# Patient Record
Sex: Male | Born: 1975 | Race: White | Hispanic: No | Marital: Married | Smoking: Former smoker
Health system: Southern US, Community
[De-identification: ages and names within clinical notes are randomized; demographics above are authoritative.]

## PROBLEM LIST (undated history)

## (undated) DIAGNOSIS — B019 Varicella without complication: Secondary | ICD-10-CM

## (undated) DIAGNOSIS — I10 Essential (primary) hypertension: Secondary | ICD-10-CM

## (undated) HISTORY — DX: Varicella without complication: B01.9

## (undated) HISTORY — DX: Essential (primary) hypertension: I10

---

## 2007-11-10 HISTORY — PX: CYST REMOVAL HAND: SHX6279

## 2015-03-12 ENCOUNTER — Ambulatory Visit: Payer: Self-pay | Admitting: Primary Care

## 2015-03-21 ENCOUNTER — Encounter: Payer: Self-pay | Admitting: Nurse Practitioner

## 2015-03-21 ENCOUNTER — Encounter (INDEPENDENT_AMBULATORY_CARE_PROVIDER_SITE_OTHER): Payer: Self-pay

## 2015-03-21 ENCOUNTER — Ambulatory Visit (INDEPENDENT_AMBULATORY_CARE_PROVIDER_SITE_OTHER): Payer: BLUE CROSS/BLUE SHIELD | Admitting: Nurse Practitioner

## 2015-03-21 VITALS — BP 150/110 | HR 90 | Temp 98.7°F | Resp 12 | Ht 66.5 in | Wt 201.1 lb

## 2015-03-21 DIAGNOSIS — M542 Cervicalgia: Secondary | ICD-10-CM | POA: Insufficient documentation

## 2015-03-21 DIAGNOSIS — Z7689 Persons encountering health services in other specified circumstances: Secondary | ICD-10-CM | POA: Insufficient documentation

## 2015-03-21 DIAGNOSIS — Z7189 Other specified counseling: Secondary | ICD-10-CM

## 2015-03-21 DIAGNOSIS — Z131 Encounter for screening for diabetes mellitus: Secondary | ICD-10-CM

## 2015-03-21 DIAGNOSIS — Z13220 Encounter for screening for lipoid disorders: Secondary | ICD-10-CM

## 2015-03-21 DIAGNOSIS — Z130 Encounter for screening for diseases of the blood and blood-forming organs and certain disorders involving the immune mechanism: Secondary | ICD-10-CM

## 2015-03-21 DIAGNOSIS — I10 Essential (primary) hypertension: Secondary | ICD-10-CM | POA: Insufficient documentation

## 2015-03-21 LAB — CBC WITH DIFFERENTIAL/PLATELET
BASOS ABS: 0 10*3/uL (ref 0.0–0.1)
Basophils Relative: 0.4 % (ref 0.0–3.0)
EOS ABS: 0.1 10*3/uL (ref 0.0–0.7)
Eosinophils Relative: 1.7 % (ref 0.0–5.0)
HEMATOCRIT: 46.9 % (ref 39.0–52.0)
Hemoglobin: 16.4 g/dL (ref 13.0–17.0)
LYMPHS ABS: 2 10*3/uL (ref 0.7–4.0)
Lymphocytes Relative: 23.4 % (ref 12.0–46.0)
MCHC: 35 g/dL (ref 30.0–36.0)
MCV: 83 fl (ref 78.0–100.0)
Monocytes Absolute: 0.8 10*3/uL (ref 0.1–1.0)
Monocytes Relative: 9.1 % (ref 3.0–12.0)
Neutro Abs: 5.5 10*3/uL (ref 1.4–7.7)
Neutrophils Relative %: 65.4 % (ref 43.0–77.0)
Platelets: 213 10*3/uL (ref 150.0–400.0)
RBC: 5.65 Mil/uL (ref 4.22–5.81)
RDW: 13.1 % (ref 11.5–15.5)
WBC: 8.4 10*3/uL (ref 4.0–10.5)

## 2015-03-21 LAB — COMPREHENSIVE METABOLIC PANEL
ALK PHOS: 69 U/L (ref 39–117)
ALT: 61 U/L — ABNORMAL HIGH (ref 0–53)
AST: 22 U/L (ref 0–37)
Albumin: 4.9 g/dL (ref 3.5–5.2)
BILIRUBIN TOTAL: 0.7 mg/dL (ref 0.2–1.2)
BUN: 15 mg/dL (ref 6–23)
CO2: 31 mEq/L (ref 19–32)
Calcium: 10.6 mg/dL — ABNORMAL HIGH (ref 8.4–10.5)
Chloride: 103 mEq/L (ref 96–112)
Creatinine, Ser: 0.94 mg/dL (ref 0.40–1.50)
GFR: 96.02 mL/min (ref >60.00)
Glucose, Bld: 89 mg/dL (ref 70–99)
POTASSIUM: 4.7 meq/L (ref 3.5–5.1)
Sodium: 140 mEq/L (ref 135–145)
Total Protein: 7.5 g/dL (ref 6.0–8.3)

## 2015-03-21 LAB — LIPID PANEL
Cholesterol: 223 mg/dL — ABNORMAL HIGH (ref 0–200)
HDL: 40.1 mg/dL (ref >39.00)
LDL CALC: 158 mg/dL — AB (ref 0–99)
NonHDL: 182.9
Total CHOL/HDL Ratio: 6
Triglycerides: 127 mg/dL (ref 0.0–149.0)
VLDL: 25.4 mg/dL (ref 0.0–40.0)

## 2015-03-21 LAB — HEMOGLOBIN A1C: Hgb A1c MFr Bld: 4.9 % (ref 4.6–6.5)

## 2015-03-21 MED ORDER — CYCLOBENZAPRINE HCL 5 MG PO TABS: 5.0000 mg | ORAL_TABLET | Freq: Three times a day (TID) | ORAL | Status: DC | PRN

## 2015-03-21 MED ORDER — PREDNISONE 10 MG PO TABS: ORAL_TABLET | ORAL | Status: DC

## 2015-03-21 NOTE — Assessment & Plan Note (Signed)
Discussed acute and chronic issues. Reviewed health maintenance measures, PFSHx, and immunizations. Obtain routine labs Lipid panel, CBC w/ diff, A1c, and CMET.    

## 2015-03-21 NOTE — Progress Notes (Signed)
Pre visit review using our clinic review tool, if applicable. No additional management support is needed unless otherwise documented below in the visit note. 

## 2015-03-21 NOTE — Assessment & Plan Note (Signed)
Flexeril and prednisone taper rx sent to pharmacy. Gave instructions on taking prednisone and flexeril, discussed risks such as increased BP and aggravation of stomach as well as drowsiness respectively. Pt verbalized understanding. Will follow up in 4 weeks.

## 2015-03-21 NOTE — Patient Instructions (Addendum)
Please visit the lab before leaving today.   Welcome to [facility]!   Prednisone with breakfast  6 tablets on day 1, 5 tablets on day 2, 4 tablets on day 3, 3 tablets on day 4, 2 tablets day 5, 1 tablet on day 6...done!   Watch your BP because the prednisone can make it rise.

## 2015-03-21 NOTE — Progress Notes (Signed)
Subjective:    Patient ID: Shawn English, male    DOB: 06-30-1976, 39 y.o.   MRN: [id number]  HPI  Shawn English is a 39 yo male establishing care and a CC of right side neck muscle soreness x 2 months.   1) New pt info-  Diet- Eats at home more often than out   Exercise- Walking and yoga 3 x a week 1 hour  Immunizations- tdap 06/18/2012  Eye Exam- UTD, March 2016  Dental Exam- Last June  2) Chronic Problems-  HTN-Linsinopril in the past with nausea and sexual side effects    Lost 30 lbs   3) Acute Problems-  2 months of neck pain on the right side, burning pain from base of skull to top of shoulder, and constant. 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  Eyes: Negative for visual disturbance.  Respiratory: Negative for chest tightness, shortness of breath and wheezing.   Cardiovascular: Negative for chest pain, palpitations and leg swelling.  Gastrointestinal: Negative for nausea, vomiting and diarrhea.  Genitourinary: Negative for difficulty urinating.  Musculoskeletal: Positive for neck pain. Negative for myalgias, back pain, joint swelling, arthralgias, gait problem and neck stiffness.  Skin: Negative for rash.  Neurological: Negative for dizziness, weakness and numbness.  Psychiatric/Behavioral: The patient is not nervous/anxious.    Past Medical History  Diagnosis Date  . Chicken pox   . Hypertension     History   Social History  . Marital Status: Married    Spouse Name: N/A  . Number of Children: N/A  . Years of Education: N/A   Occupational History  . Not on file.   Social History  Main Topics  . Smoking status: Former Smoker    Types: Cigarettes    Quit date: 09/09/2014  . Smokeless tobacco: Never Used  . Alcohol Use: 0.6 oz/week    1 Standard drinks or equivalent per week     Comment: Occasional use   . Drug Use: No  . Sexual Activity:    Partners: Female     Comment: Fiance   Other Topics Concern  . Not on file   Social History Narrative   Lives with fiance    Child on the way, girl, due in July 2016    No Pets    Work at [employer]    College    Left handed   Caffeine- 2 cups of coffee, occasional soda    Enjoys playing guitar and traveling        Past Surgical History  Procedure Laterality Date  . Cyst removal hand  2009    Benign cyst removed from right arm    Family History  Problem Relation Age of Onset  . Hypertension Father   . Cancer Maternal Grandmother     breast    Allergies  Allergen Reactions  . Bee Venom Swelling  . Fish-Derived Products Anaphylaxis  . Penicillins Anaphylaxis  . Sulfa Antibiotics Nausea And Vomiting    No current outpatient prescriptions on file prior to visit.   No current facility-administered medications on file prior to visit.      Objective:  Physical Exam  Constitutional: He is oriented to person, place, and time. He appears well-developed and well-nourished. No distress.  BP 150/110 mmHg  Pulse 90  Temp(Src) 98.7 F (37.1 C) (Oral)  Resp 12  Ht 5' 6.5" (1.689 m)  Wt 201 lb 1.9 oz (91.227 kg)  BMI 31.98 kg/m2  SpO2 99%   HENT:  Head: Normocephalic and atraumatic.  Right Ear: External ear normal.  Left Ear: External ear normal.  Cardiovascular: Normal rate, regular rhythm, normal heart sounds and intact distal pulses.  Exam reveals no gallop and no friction rub.   No murmur heard. Pulmonary/Chest: Effort normal and breath sounds normal. No respiratory distress. He has no wheezes. He has no rales. He exhibits no tenderness.  Musculoskeletal: He exhibits tenderness.    Tenderness to palpation of right side of trapezius muscle to base of skull Decreased ROM of neck with extension and turning to the right.   Neurological: He is alert and oriented to person, place, and time.  Deltoid 5/5 Bilateral, Biceps 5/5 bilateral, Wrist extensors 5/5 bilateral, Triceps 5/5 bilateral, finger flexors and abductors 5/5 bilateral, grip 5/5 bilateraly no Hoffman's, intact heel/toe/sequential walking, sensation intact upper and lower extremities. Straight leg raise negative bilaterally. DTR's upper and lower 2+   Skin: Skin is warm and dry. No rash noted. He is not diaphoretic.  Psychiatric: He has a normal mood and affect. His behavior is normal. Judgment and thought content normal.      Assessment & Plan:

## 2015-03-21 NOTE — Assessment & Plan Note (Signed)
Uncontrolled. Patient and I discussed implications such as MI and stroke of uncontrolled high blood pressure and he verbalized understanding, but feels his BP is up due to pain. I discussed that prednisone taper may increase BP and he stated he will let me know if it worsens and we can discuss a BP medication. He expressed multiple times that he does not want to be on medication for HTN at this time. He is working on diet and exercise and is down 4 lbs. Will follow up in 4 weeks and obtain a CMET today.

## 2015-04-19 ENCOUNTER — Ambulatory Visit: Payer: BLUE CROSS/BLUE SHIELD | Admitting: Nurse Practitioner

## 2015-04-24 ENCOUNTER — Ambulatory Visit: Payer: BLUE CROSS/BLUE SHIELD | Admitting: Nurse Practitioner

## 2015-05-08 ENCOUNTER — Ambulatory Visit (INDEPENDENT_AMBULATORY_CARE_PROVIDER_SITE_OTHER): Payer: BLUE CROSS/BLUE SHIELD | Admitting: Nurse Practitioner

## 2015-05-08 ENCOUNTER — Encounter (INDEPENDENT_AMBULATORY_CARE_PROVIDER_SITE_OTHER): Payer: Self-pay

## 2015-05-08 ENCOUNTER — Encounter: Payer: Self-pay | Admitting: Nurse Practitioner

## 2015-05-08 VITALS — BP 162/106 | HR 97 | Temp 98.6°F | Resp 18 | Ht 66.5 in | Wt 203.4 lb

## 2015-05-08 DIAGNOSIS — Z889 Allergy status to unspecified drugs, medicaments and biological substances status: Secondary | ICD-10-CM | POA: Insufficient documentation

## 2015-05-08 DIAGNOSIS — I10 Essential (primary) hypertension: Secondary | ICD-10-CM | POA: Diagnosis not present

## 2015-05-08 DIAGNOSIS — M542 Cervicalgia: Secondary | ICD-10-CM | POA: Diagnosis not present

## 2015-05-08 DIAGNOSIS — Z9109 Other allergy status, other than to drugs and biological substances: Secondary | ICD-10-CM | POA: Diagnosis not present

## 2015-05-08 MED ORDER — HYDROCHLOROTHIAZIDE 12.5 MG PO TABS: 12.5000 mg | ORAL_TABLET | Freq: Every day | ORAL | Status: DC

## 2015-05-08 MED ORDER — EPINEPHRINE 0.3 MG/0.3ML IJ SOAJ
0.3000 mg | Freq: Once | INTRAMUSCULAR | Status: DC
Start: 2015-05-08 — End: 2016-08-24

## 2015-05-08 NOTE — Assessment & Plan Note (Signed)
BP Readings from Last 3 Encounters:  05/08/15 162/106  03/21/15 150/110   Lab Results  Component Value Date   CREATININE 0.94 03/21/2015   Will start HCTZ 12.5 mg. Potassium 4.7 in May. Asked for pt to call in a BP by July 11th (pt having a baby girl next week).

## 2015-05-08 NOTE — Patient Instructions (Addendum)
Call us July 11th to report a BP. You can always make a nurse appointment to pop in and have it checked (no copay) if you can't get to a machine. [phone]   See you in 3 months for HTN check up.   Good luck with everything next week!

## 2015-05-08 NOTE — Assessment & Plan Note (Signed)
Epi pen sent to pharmacy. Discussed usage and when to call EMS.

## 2015-05-08 NOTE — Progress Notes (Signed)
   Subjective:    Patient ID: Shawn English, male    DOB: Apr 14, 1976, 39 y.o.   MRN: [id number]  HPI  Shawn English is a 39 yo male with a follow up of HTN and Neck pain.   1) HTN- Lisinopril in past- sexual side effects and heart burn, not tried anything else, lost 30 lbs in the past and came off of medications.   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 Anaphylaxis reported. Pt's wife concerned and would like for him to have an epi-pen.   Review of Systems  Constitutional: Negative for fever, chills, diaphoresis and fatigue.  Eyes: Negative for visual disturbance.  Respiratory: Negative for chest tightness, shortness of breath and wheezing.   Cardiovascular: Negative for chest pain, palpitations and leg swelling.  Gastrointestinal: Negative for nausea, vomiting and diarrhea.  Endocrine: Negative for polydipsia, polyphagia and polyuria.  Skin: Negative for rash.  Neurological: Negative for dizziness, weakness and numbness.  Psychiatric/Behavioral: The patient is not nervous/anxious.       Objective:   Physical Exam  Constitutional: He is oriented to person, place, and time. He appears well-developed and well-nourished. No distress.  BP 162/106 mmHg  Pulse 97  Temp(Src) 98.6 F (37 C)  Resp 18  Ht 5' 6.5" (1.689 m)  Wt 203 lb 6.4 oz (92.262 kg)  BMI 32.34 kg/m2  SpO2 96% Rechecked twice    HENT:  Head: Normocephalic and atraumatic.  Right Ear: External ear normal.  Left Ear: External ear normal.  Eyes: EOM are normal. Pupils are equal, round, and reactive to light. Right eye exhibits no discharge. Left eye exhibits no discharge. No scleral icterus.  Cardiovascular: Normal rate, regular rhythm and normal heart sounds.  Exam reveals no gallop and no friction rub.   No murmur heard. Pulmonary/Chest: Effort normal and breath sounds normal. No respiratory distress. He has no wheezes. He has no  rales. He exhibits no tenderness.  Neurological: He is alert and oriented to person, place, and time.  Skin: Skin is warm and dry. No rash noted. He is not diaphoretic.  Psychiatric: He has a normal mood and affect. His behavior is normal. Judgment and thought content normal.      Assessment & Plan:

## 2015-05-08 NOTE — Assessment & Plan Note (Signed)
Improved with deep tissue massage. No further complaints

## 2015-05-08 NOTE — Progress Notes (Signed)
Pre visit review using our clinic review tool, if applicable. No additional management support is needed unless otherwise documented below in the visit note. 

## 2015-07-31 ENCOUNTER — Encounter: Payer: Self-pay | Admitting: Nurse Practitioner

## 2015-07-31 ENCOUNTER — Encounter (INDEPENDENT_AMBULATORY_CARE_PROVIDER_SITE_OTHER): Payer: Self-pay

## 2015-07-31 ENCOUNTER — Ambulatory Visit (INDEPENDENT_AMBULATORY_CARE_PROVIDER_SITE_OTHER): Payer: BLUE CROSS/BLUE SHIELD | Admitting: Nurse Practitioner

## 2015-07-31 VITALS — BP 158/118 | HR 86 | Temp 98.5°F | Resp 18 | Ht 67.0 in | Wt 201.8 lb

## 2015-07-31 DIAGNOSIS — I10 Essential (primary) hypertension: Secondary | ICD-10-CM

## 2015-07-31 DIAGNOSIS — Z23 Encounter for immunization: Secondary | ICD-10-CM

## 2015-07-31 LAB — BASIC METABOLIC PANEL
BUN: 12 mg/dL (ref 6–23)
CALCIUM: 10.1 mg/dL (ref 8.4–10.5)
CO2: 33 meq/L — AB (ref 19–32)
CREATININE: 0.98 mg/dL (ref 0.40–1.50)
Chloride: 102 mEq/L (ref 96–112)
GFR: 91.33 mL/min (ref >60.00)
Glucose, Bld: 91 mg/dL (ref 70–99)
Potassium: 4 mEq/L (ref 3.5–5.1)
Sodium: 141 mEq/L (ref 135–145)

## 2015-07-31 MED ORDER — LOSARTAN POTASSIUM 25 MG PO TABS: 25.0000 mg | ORAL_TABLET | Freq: Every day | ORAL | Status: DC

## 2015-07-31 NOTE — Progress Notes (Signed)
Patient ID: Shawn English, male    DOB: [birth year]  Age: 39 y.o. MRN: [id number]  CC: Follow-up   HPI Shawn English presents for follow up of HTN.   1) Started pt on HCTZ 12.5 mg, helpful, but not at goal.  Checked it once before and it was high he reports at [facility] pharmacy  Baby girl has murmur and he only slept 1 hr last night due to her going to a cardiology appointment this morning after our appnt.   Pt reports feeling okay. He is adjusting to new dad life well. No complaints today.   History Shawn English has a past medical history of Chicken pox and Hypertension.   He has past surgical history that includes Cyst removal hand (2009).   His family history includes Cancer in his maternal grandmother; Hypertension in his father.He reports that he quit smoking about 10 months ago. His smoking use included Cigarettes. He has never used smokeless tobacco. He reports that he drinks about 0.6 oz of alcohol per week. He reports that he does not use illicit drugs.  Outpatient Prescriptions Prior to Visit  Medication Sig Dispense Refill  . EPINEPHrine 0.3 mg/0.3 mL IJ SOAJ injection Inject 0.3 mLs (0.3 mg total) into the muscle once. 1 Device 2  . hydrochlorothiazide (HYDRODIURIL) 12.5 MG tablet Take 1 tablet (12.5 mg total) by mouth daily. 90 tablet 3  . cyclobenzaprine (FLEXERIL) 5 MG tablet Take 1 tablet (5 mg total) by mouth 3 (three) times daily as needed for muscle spasms. (Patient not taking: Reported on 07/31/2015) 30 tablet 1  . predniSONE (DELTASONE) 10 MG tablet Take 6 tablets with breakfast by mouth on day 1 then decrease by 1 tablet daily until done. (Patient not taking: Reported on 05/08/2015) 21 tablet 0   No facility-administered medications prior to visit.    ROS Review of Systems  Constitutional: Negative for fever, chills, diaphoresis and fatigue.  Eyes: Negative for visual disturbance.  Respiratory: Negative for chest tightness, shortness of breath and wheezing.   Cardiovascular:  Negative for chest pain, palpitations and leg swelling.  Gastrointestinal: Negative for nausea, vomiting and diarrhea.  Skin: Negative for rash.  Neurological: Negative for dizziness and weakness.  Psychiatric/Behavioral: The patient is nervous/anxious.     Objective:  BP 158/118 mmHg  Pulse 86  Temp(Src) 98.5 F (36.9 C)  Resp 18  Ht  (1.702 m)  Wt 201 lb 12.8 oz (91.536 kg)  BMI 31.60 kg/m2  SpO2 94%  Physical Exam  Constitutional: He is oriented to person, place, and time. He appears well-developed and well-nourished. No distress.  HENT:  Head: Normocephalic and atraumatic.  Right Ear: External ear normal.  Left Ear: External ear normal.  Cardiovascular: Normal rate, regular rhythm and normal heart sounds.  Exam reveals no gallop and no friction rub.   No murmur heard. Pulmonary/Chest: Effort normal and breath sounds normal. No respiratory distress. He has no wheezes. He has no rales. He exhibits no tenderness.  Neurological: He is alert and oriented to person, place, and time.  Skin: Skin is warm and dry. No rash noted. He is not diaphoretic.  Psychiatric: He has a normal mood and affect. His behavior is normal. Judgment and thought content normal.   Assessment & Plan:   Shawn English was seen today for follow-up.  Diagnoses and all orders for this visit:  Benign essential HTN -     Basic Metabolic Panel (BMET)  Essential hypertension  Other orders -     losartan (COZAAR)  25 MG tablet; Take 1 tablet (25 mg total) by mouth daily.   I am having Shawn English start on losartan. I am also having him maintain his predniSONE, cyclobenzaprine, hydrochlorothiazide, and EPINEPHrine.  Meds ordered this encounter  Medications  . losartan (COZAAR) 25 MG tablet    Sig: Take 1 tablet (25 mg total) by mouth daily.    Dispense:  30 tablet    Refill:  1    Order Specific Question:  Supervising Provider    Answer:  Sherlene Shams [[id number]]     Follow-up: Return in about 4 weeks  (around 08/28/2015) for HTN.

## 2015-07-31 NOTE — Assessment & Plan Note (Addendum)
BP Readings from Last 3 Encounters:  07/31/15 158/118  05/08/15 162/106  03/21/15 150/110   Uncontrolled on HCTZ, failed Lisinopril, willing to add Losartan 25 mg. Quick diet and exercise info to help with stress of new baby. Obtain BMET. FU in 4 weeks.

## 2015-07-31 NOTE — Progress Notes (Signed)
Pre visit review using our clinic review tool, if applicable. No additional management support is needed unless otherwise documented below in the visit note. 

## 2015-07-31 NOTE — Patient Instructions (Signed)
Please visit the lab.   Follow up in 4 weeks for HTN.   Losartan 25 mg with HCTZ.

## 2015-08-08 ENCOUNTER — Ambulatory Visit: Payer: BLUE CROSS/BLUE SHIELD | Admitting: Nurse Practitioner

## 2015-08-28 ENCOUNTER — Ambulatory Visit (INDEPENDENT_AMBULATORY_CARE_PROVIDER_SITE_OTHER): Payer: BLUE CROSS/BLUE SHIELD | Admitting: Nurse Practitioner

## 2015-08-28 ENCOUNTER — Encounter: Payer: Self-pay | Admitting: Nurse Practitioner

## 2015-08-28 VITALS — BP 150/122 | HR 89 | Temp 98.8°F | Resp 18 | Ht 67.0 in | Wt 202.8 lb

## 2015-08-28 DIAGNOSIS — I10 Essential (primary) hypertension: Secondary | ICD-10-CM | POA: Diagnosis not present

## 2015-08-28 MED ORDER — LOSARTAN POTASSIUM-HCTZ 50-12.5 MG PO TABS: 1.0000 | ORAL_TABLET | Freq: Every day | ORAL | Status: DC

## 2015-08-28 NOTE — Progress Notes (Signed)
Patient ID: Shawn English Binning, male    DOB: 1976/05/06  Age: 39 y.o. MRN: [id number]  CC: Follow-up   HPI Shawn English Lonigro presents for follow up for HTN.   1)  Ran out of HCTZ 2 weeks ago   Losartan this morning only   Not checking BP Slept 4 hours due to 383 month old last not  History Baldo AshCarl has a past medical history of Chicken pox and Hypertension.   He has past surgical history that includes Cyst removal hand (2009).   His family history includes Cancer in his maternal grandmother; Hypertension in his father.He reports that he quit smoking about a year ago. His smoking use included Cigarettes. He has never used smokeless tobacco. He reports that he drinks about 0.6 oz of alcohol per week. He reports that he does not use illicit drugs.  Outpatient Prescriptions Prior to Visit  Medication Sig Dispense Refill  . EPINEPHrine 0.3 mg/0.3 mL IJ SOAJ injection Inject 0.3 mLs (0.3 mg total) into the muscle once. 1 Device 2  . hydrochlorothiazide (HYDRODIURIL) 12.5 MG tablet Take 1 tablet (12.5 mg total) by mouth daily. 90 tablet 3  . losartan (COZAAR) 25 MG tablet Take 1 tablet (25 mg total) by mouth daily. 30 tablet 1  . cyclobenzaprine (FLEXERIL) 5 MG tablet Take 1 tablet (5 mg total) by mouth 3 (three) times daily as needed for muscle spasms. (Patient not taking: Reported on 07/31/2015) 30 tablet 1  . predniSONE (DELTASONE) 10 MG tablet Take 6 tablets with breakfast by mouth on day 1 then decrease by 1 tablet daily until done. (Patient not taking: Reported on 05/08/2015) 21 tablet 0   No facility-administered medications prior to visit.    ROS Review of Systems  Constitutional: Negative for fever, chills, diaphoresis and fatigue.  Eyes: Negative for visual disturbance.  Respiratory: Negative for chest tightness, shortness of breath and wheezing.   Cardiovascular: Negative for chest pain, palpitations and leg swelling.  Neurological: Negative for dizziness, weakness and numbness.   Psychiatric/Behavioral: The patient is not nervous/anxious.     Objective:  BP 150/122 mmHg  Pulse 89  Temp(Src) 98.8 F (37.1 C)  Resp 18  Ht 5\' 7"  (1.702 m)  Wt 202 lb 12.8 oz (91.989 kg)  BMI 31.76 kg/m2  SpO2 96%  Physical Exam  Constitutional: He is oriented to person, place, and time. He appears well-developed and well-nourished. No distress.  HENT:  Head: Normocephalic and atraumatic.  Right Ear: External ear normal.  Left Ear: External ear normal.  Cardiovascular: Normal rate, regular rhythm and normal heart sounds.  Exam reveals no gallop and no friction rub.   No murmur heard. Pulmonary/Chest: Effort normal and breath sounds normal. No respiratory distress. He has no wheezes. He has no rales. He exhibits no tenderness.  Neurological: He is alert and oriented to person, place, and time.  Skin: Skin is warm and dry. No rash noted. He is not diaphoretic.  Psychiatric: He has a normal mood and affect. His behavior is normal. Judgment and thought content normal.   Assessment & Plan:   There are no diagnoses linked to this encounter. I have discontinued Mr. Marcina MillardRoach's predniSONE, cyclobenzaprine, hydrochlorothiazide, and losartan. I am also having him start on losartan-hydrochlorothiazide. Additionally, I am having him maintain his EPINEPHrine.  Meds ordered this encounter  Medications  . losartan-hydrochlorothiazide (HYZAAR) 50-12.5 MG tablet    Sig: Take 1 tablet by mouth daily.    Dispense:  90 tablet    Refill:  0  Order Specific Question:  Supervising Provider    Answer:  Sherlene Shams [[id number]]     Follow-up: Return in about 3 weeks (around 09/18/2015).

## 2015-08-28 NOTE — Assessment & Plan Note (Signed)
BP Readings from Last 3 Encounters:  08/28/15 150/122  07/31/15 158/118  05/08/15 162/106   Uncontrolled. Pt has not been taking both for the last 2 weeks because he ran out. I asked him to contact me if this happens next time. Will change to Hyzaar 50-12.5 mg. FU in 3 weeks.

## 2015-08-28 NOTE — Progress Notes (Signed)
Pre visit review using our clinic review tool, if applicable. No additional management support is needed unless otherwise documented below in the visit note. 

## 2015-08-28 NOTE — Patient Instructions (Signed)
Please try Losartan- HCTZ and follow up with me in 3 weeks.

## 2015-09-26 ENCOUNTER — Encounter: Payer: Self-pay | Admitting: Nurse Practitioner

## 2015-09-26 ENCOUNTER — Ambulatory Visit (INDEPENDENT_AMBULATORY_CARE_PROVIDER_SITE_OTHER): Payer: BLUE CROSS/BLUE SHIELD | Admitting: Nurse Practitioner

## 2015-09-26 VITALS — BP 160/104 | HR 82 | Temp 99.4°F | Resp 18 | Ht 67.0 in | Wt 204.4 lb

## 2015-09-26 DIAGNOSIS — I10 Essential (primary) hypertension: Secondary | ICD-10-CM | POA: Diagnosis not present

## 2015-09-26 NOTE — Patient Instructions (Signed)
Amlodipine (norvasc) is the third component (5 mg we would start you at).   See you in Jan.

## 2015-09-26 NOTE — Progress Notes (Signed)
Patient ID: Shawn MeyerCarl Aken, male    DOB: 1976/01/05  Age: 39 y.o. MRN: [id number]  CC: Follow-up   HPI Shawn English presents for follow up of HTN.   1) Tried lisinopril in past, pt reports his office is doing some form of health measures and he reports this starts on Jan. 2nd 2017. He is motivated to lose weight, become more active, work on coping with stress. He is adamant about trying these measures to bring down his BP instead of adding another medication.    History Shawn English has a past medical history of Chicken pox and Hypertension.   He has past surgical history that includes Cyst removal hand (2009).   His family history includes Cancer in his maternal grandmother; Hypertension in his father.He reports that he quit smoking about 12 months ago. His smoking use included Cigarettes. He has never used smokeless tobacco. He reports that he drinks about 0.6 oz of alcohol per week. He reports that he does not use illicit drugs.  Outpatient Prescriptions Prior to Visit  Medication Sig Dispense Refill  . EPINEPHrine 0.3 mg/0.3 mL IJ SOAJ injection Inject 0.3 mLs (0.3 mg total) into the muscle once. 1 Device 2  . losartan-hydrochlorothiazide (HYZAAR) 50-12.5 MG tablet Take 1 tablet by mouth daily. 90 tablet 0   No facility-administered medications prior to visit.    ROS Review of Systems  Constitutional: Negative for fever, chills, diaphoresis and fatigue.  Eyes: Negative for visual disturbance.  Respiratory: Negative for chest tightness, shortness of breath and wheezing.   Cardiovascular: Negative for chest pain, palpitations and leg swelling.  Gastrointestinal: Negative for nausea, vomiting and diarrhea.  Skin: Negative for rash.  Neurological: Negative for dizziness, weakness and numbness.  Psychiatric/Behavioral: The patient is not nervous/anxious.     Objective:  BP 160/104 mmHg  Pulse 82  Temp(Src) 99.4 F (37.4 C)  Resp 18  Ht 5\' 7"  (1.702 m)  Wt 204 lb 6.4 oz (92.715 kg)  BMI  32.01 kg/m2  SpO2 97%  Physical Exam  Constitutional: He is oriented to person, place, and time. He appears well-developed and well-nourished. No distress.  HENT:  Head: Normocephalic and atraumatic.  Right Ear: External ear normal.  Left Ear: External ear normal.  Neurological: He is alert and oriented to person, place, and time.  Skin: Skin is warm and dry. No rash noted. He is not diaphoretic.  Psychiatric: He has a normal mood and affect. His behavior is normal. Judgment and thought content normal.   Assessment & Plan:   There are no diagnoses linked to this encounter. I am having Shawn English maintain his EPINEPHrine and losartan-hydrochlorothiazide.  No orders of the defined types were placed in this encounter.     Follow-up: Return in about 2 months (around 11/26/2015) for HTN follow up.

## 2015-09-26 NOTE — Progress Notes (Signed)
Pre visit review using our clinic review tool, if applicable. No additional management support is needed unless otherwise documented below in the visit note. 

## 2015-09-26 NOTE — Assessment & Plan Note (Signed)
BP Readings from Last 3 Encounters:  09/26/15 160/104  08/28/15 150/122  07/31/15 158/118   Uncontrolled. Pt reports he is doing well on Hyzaar without side effects. He is adamant about not adding another med at this time and wants to work on diet and exercise. Asked him to look at amlodipine as another possible option. Will follow up in Jan.

## 2015-11-25 ENCOUNTER — Ambulatory Visit (INDEPENDENT_AMBULATORY_CARE_PROVIDER_SITE_OTHER): Payer: BLUE CROSS/BLUE SHIELD | Admitting: Nurse Practitioner

## 2015-11-25 ENCOUNTER — Encounter: Payer: Self-pay | Admitting: Nurse Practitioner

## 2015-11-25 VITALS — BP 134/96 | HR 78 | Temp 98.6°F | Resp 16 | Ht 67.0 in | Wt 205.0 lb

## 2015-11-25 DIAGNOSIS — I10 Essential (primary) hypertension: Secondary | ICD-10-CM | POA: Diagnosis not present

## 2015-11-25 MED ORDER — LOSARTAN POTASSIUM-HCTZ 50-12.5 MG PO TABS: 1.0000 | ORAL_TABLET | Freq: Every day | ORAL | Status: DC

## 2015-11-25 NOTE — Patient Instructions (Signed)
Keep working with Risk analysthealth coach and nurse.   Please get me a copy of your lab work [phone] fax number

## 2015-11-25 NOTE — Progress Notes (Signed)
Patient ID: Shawn English, male    DOB: May 01, 1976  Age: 40 y.o. MRN: [id number]  CC: Hypertension   HPI Shawn English presents for HTN follow up.   1) Improving, but not at goal 2 hrs sleep and stress due to work  Started with health coach at work, seeing a Engineer, civil (consulting) at his work  [employer] getting full panel tomorrow    History Shawn English has a past medical history of Chicken pox and Hypertension.   He has past surgical history that includes Cyst removal hand (2009).   His family history includes Cancer in his maternal grandmother; Hypertension in his father.He reports that he quit smoking about 14 months ago. His smoking use included Cigarettes. He has never used smokeless tobacco. He reports that he drinks about 0.6 oz of alcohol per week. He reports that he does not use illicit drugs.  Outpatient Prescriptions Prior to Visit  Medication Sig Dispense Refill  . EPINEPHrine 0.3 mg/0.3 mL IJ SOAJ injection Inject 0.3 mLs (0.3 mg total) into the muscle once. 1 Device 2  . losartan-hydrochlorothiazide (HYZAAR) 50-12.5 MG tablet Take 1 tablet by mouth daily. 90 tablet 0   No facility-administered medications prior to visit.    ROS Review of Systems  Constitutional: Negative for fever, chills, diaphoresis and fatigue.  HENT: Negative for tinnitus and trouble swallowing.   Eyes: Negative for visual disturbance.  Respiratory: Negative for chest tightness, shortness of breath and wheezing.   Cardiovascular: Negative for chest pain, palpitations and leg swelling.  Skin: Negative for rash.  Neurological: Negative for dizziness, weakness and numbness.  Psychiatric/Behavioral: The patient is not nervous/anxious.     Objective:  BP 134/96 mmHg  Pulse 78  Temp(Src) 98.6 F (37 C) (Oral)  Resp 16  Ht 5\' 7"  (1.702 m)  Wt 205 lb (92.987 kg)  BMI 32.10 kg/m2  SpO2 98%  Physical Exam  Constitutional: He is oriented to person, place, and time. He appears well-developed and well-nourished. No distress.   HENT:  Head: Normocephalic and atraumatic.  Right Ear: External ear normal.  Left Ear: External ear normal.  Eyes: EOM are normal. Pupils are equal, round, and reactive to light. Right eye exhibits no discharge. Left eye exhibits no discharge. No scleral icterus.  Cardiovascular: Normal rate, regular rhythm and normal heart sounds.  Exam reveals no gallop and no friction rub.   No murmur heard. Pulmonary/Chest: Effort normal and breath sounds normal. No respiratory distress. He has no wheezes. He has no rales. He exhibits no tenderness.  Neurological: He is alert and oriented to person, place, and time.  Skin: Skin is warm and dry. No rash noted. He is not diaphoretic.  Psychiatric: He has a normal mood and affect. His behavior is normal. Judgment and thought content normal.   Assessment & Plan:   Shawn English was seen today for hypertension.  Diagnoses and all orders for this visit:  Essential hypertension  Other orders -     losartan-hydrochlorothiazide (HYZAAR) 50-12.5 MG tablet; Take 1 tablet by mouth daily.   I am having Shawn English maintain his EPINEPHrine and losartan-hydrochlorothiazide.  Meds ordered this encounter  Medications  . losartan-hydrochlorothiazide (HYZAAR) 50-12.5 MG tablet    Sig: Take 1 tablet by mouth daily.    Dispense:  90 tablet    Refill:  0    Order Specific Question:  Supervising Provider    Answer:  Sherlene Shams [[id number]]     Follow-up: Return in about 3 months (around 02/23/2016) for Follow up  for HTN.

## 2015-11-25 NOTE — Assessment & Plan Note (Addendum)
BP Readings from Last 3 Encounters:  11/25/15 134/96  09/26/15 160/104  08/28/15 150/122   Improving, pt reports he slept 1.5 hrs and had to be at work for an IT issue. He is getting lab work for his job at [employer] tomorrow. He is working with a Control and instrumentation engineerhealth coach at his work to improve his [employer] and health. FU in 3 months. Gave him fax  to send his lab results. Refills sent

## 2015-11-26 ENCOUNTER — Ambulatory Visit: Payer: BLUE CROSS/BLUE SHIELD | Admitting: Nurse Practitioner

## 2016-02-27 ENCOUNTER — Ambulatory Visit (INDEPENDENT_AMBULATORY_CARE_PROVIDER_SITE_OTHER): Payer: BLUE CROSS/BLUE SHIELD | Admitting: Nurse Practitioner

## 2016-02-27 ENCOUNTER — Encounter: Payer: Self-pay | Admitting: Nurse Practitioner

## 2016-02-27 VITALS — BP 147/96 | HR 87 | Temp 98.2°F | Ht 67.0 in | Wt 206.5 lb

## 2016-02-27 DIAGNOSIS — I10 Essential (primary) hypertension: Secondary | ICD-10-CM

## 2016-02-27 MED ORDER — LOSARTAN POTASSIUM-HCTZ 50-12.5 MG PO TABS: 1.0000 | ORAL_TABLET | Freq: Every day | ORAL | Status: DC

## 2016-02-27 NOTE — Progress Notes (Signed)
Pre visit review using our clinic review tool, if applicable. No additional management support is needed unless otherwise documented below in the visit note. 

## 2016-02-27 NOTE — Patient Instructions (Signed)
Follow up in 3 months for blood pressure.   Fax me your labs [phone] [facility] [place] [facility]- Attn: Naomie DeanCarrie Indie Nickerson

## 2016-02-27 NOTE — Assessment & Plan Note (Signed)
BP Readings from Last 3 Encounters:  02/27/16 147/96  11/25/15 134/96  09/26/15 160/104   Pt has been off of medication x 2 days. Refilled today. Pt had recent lab work at [employer] and will fax to me.

## 2016-02-27 NOTE — Progress Notes (Signed)
Patient ID: Shawn English, male    DOB: 13-Mar-1976  Age: 40 y.o. MRN: [id number]  CC: Follow-up   HPI Shawn English presents for follow up of HTN.   1) BP running 132/87's at home  Ran out yesterday of Hyzaar  Wt 202.02 lbs at home   Just moved recently and walks in the park with his daughter in a stroller most nights  Denies chest pain, tightness, SOB, DOE, leg swelling, nor palpitations.   History Shawn English has a past medical history of Chicken pox and Hypertension.   Shawn English has past surgical history that includes Cyst removal hand (2009).   His family history includes Cancer in his maternal grandmother; Hypertension in his father.Shawn English reports that Shawn English quit smoking about 17 months ago. His smoking use included Cigarettes. Shawn English has never used smokeless tobacco. Shawn English reports that Shawn English drinks about 0.6 oz of alcohol per week. Shawn English reports that Shawn English does not use illicit drugs.  Outpatient Prescriptions Prior to Visit  Medication Sig Dispense Refill  . EPINEPHrine 0.3 mg/0.3 mL IJ SOAJ injection Inject 0.3 mLs (0.3 mg total) into the muscle once. 1 Device 2  . losartan-hydrochlorothiazide (HYZAAR) 50-12.5 MG tablet Take 1 tablet by mouth daily. 90 tablet 0   No facility-administered medications prior to visit.    ROS Review of Systems  Constitutional: Negative for fever, chills, diaphoresis and fatigue.  Eyes: Negative for visual disturbance.  Respiratory: Negative for chest tightness, shortness of breath and wheezing.   Cardiovascular: Negative for chest pain, palpitations and leg swelling.  Neurological: Negative for dizziness and numbness.   Objective:  BP 147/96 mmHg  Pulse 87  Temp(Src) 98.2 F (36.8 C) (Oral)  Ht 5' 7"  (1.702 m)  Wt 206 lb 8 oz (93.668 kg)  BMI 32.33 kg/m2  SpO2 99%  Physical Exam  Constitutional: Shawn English is oriented to person, place, and time. Shawn English appears well-developed and well-nourished. No distress.  HENT:  Head: Normocephalic and atraumatic.  Right Ear: External ear normal.   Left Ear: External ear normal.  Cardiovascular: Normal rate, regular rhythm and normal heart sounds.   Pulmonary/Chest: Effort normal and breath sounds normal. No respiratory distress. Shawn English has no wheezes. Shawn English has no rales. Shawn English exhibits no tenderness.  Neurological: Shawn English is alert and oriented to person, place, and time.  Skin: Skin is warm and dry. No rash noted. Shawn English is not diaphoretic.  Psychiatric: Shawn English has a normal mood and affect. His behavior is normal. Judgment and thought content normal.   Assessment & Plan:   Shawn English was seen today for follow-up.  Diagnoses and all orders for this visit:  Essential hypertension -     Cancel: CBC with Differential/Platelet -     Cancel: Comp Met (CMET)  Other orders -     losartan-hydrochlorothiazide (HYZAAR) 50-12.5 MG tablet; Take 1 tablet by mouth daily.   I am having Shawn English maintain his EPINEPHrine and losartan-hydrochlorothiazide.  Meds ordered this encounter  Medications  . losartan-hydrochlorothiazide (HYZAAR) 50-12.5 MG tablet    Sig: Take 1 tablet by mouth daily.    Dispense:  90 tablet    Refill:  1     Follow-up: Return in about 3 months (around 05/28/2016).

## 2016-06-04 ENCOUNTER — Encounter: Payer: Self-pay | Admitting: Family Medicine

## 2016-06-04 ENCOUNTER — Ambulatory Visit: Payer: BLUE CROSS/BLUE SHIELD | Admitting: Nurse Practitioner

## 2016-06-04 ENCOUNTER — Encounter (INDEPENDENT_AMBULATORY_CARE_PROVIDER_SITE_OTHER): Payer: Self-pay

## 2016-06-04 ENCOUNTER — Ambulatory Visit (INDEPENDENT_AMBULATORY_CARE_PROVIDER_SITE_OTHER): Payer: BLUE CROSS/BLUE SHIELD | Admitting: Family Medicine

## 2016-06-04 VITALS — BP 136/88 | HR 99 | Temp 98.3°F | Wt 203.4 lb

## 2016-06-04 DIAGNOSIS — I10 Essential (primary) hypertension: Secondary | ICD-10-CM | POA: Diagnosis not present

## 2016-06-04 DIAGNOSIS — E669 Obesity, unspecified: Secondary | ICD-10-CM | POA: Diagnosis not present

## 2016-06-04 LAB — BASIC METABOLIC PANEL
BUN: 12 mg/dL (ref 6–23)
CHLORIDE: 101 meq/L (ref 96–112)
CO2: 31 mEq/L (ref 19–32)
Calcium: 10.2 mg/dL (ref 8.4–10.5)
Creatinine, Ser: 1.02 mg/dL (ref 0.40–1.50)
GFR: 86.82 mL/min (ref >60.00)
GLUCOSE: 87 mg/dL (ref 70–99)
Potassium: 4 mEq/L (ref 3.5–5.1)
Sodium: 139 mEq/L (ref 135–145)

## 2016-06-04 NOTE — Assessment & Plan Note (Signed)
Weight is down 3 pounds since his last visit. Encouraged to continue to work on diet and exercise.

## 2016-06-04 NOTE — Progress Notes (Signed)
  Marikay Alar, MD Phone: [phone]  Shawn English is a 40 y.o. male who presents today for f/u.  HYPERTENSION  Disease Monitoring  Home BP Monitoring not checking consistently, last checked 3 weeks ago 140/90 Chest pain- no    Dyspnea- no Medications  Compliance-  Taking losartan-HCTZ.   Edema- no  Obesity: Patient notes she's been working on diet. Has cut out carbs. Sodium is mostly nonexistent. He is not eating any refined sugars. He started walking though recently this has been limited by the heat and he plans to get back to this one cycles off.  PMH: Former smoker   ROS see history of present illness  Objective  Physical Exam Vitals:   06/04/16 0802  BP: 136/88  Pulse: 99  Temp: 98.3 F (36.8 C)    BP Readings from Last 3 Encounters:  06/04/16 136/88  02/27/16 (!) 147/96  11/25/15 (!) 134/96   Wt Readings from Last 3 Encounters:  06/04/16 203 lb 6.4 oz (92.3 kg)  02/27/16 206 lb 8 oz (93.7 kg)  11/25/15 205 lb (93 kg)    Physical Exam  Constitutional: No distress.  HENT:  Head: Normocephalic and atraumatic.  Cardiovascular: Normal rate and regular rhythm.   Pulmonary/Chest: Effort normal and breath sounds normal.  Neurological: He is alert.  Skin: Skin is warm and dry. He is not diaphoretic.     Assessment/Plan: Please see individual problem list.  Essential hypertension At goal. Continue current medication. Continue to work on diet and exercise.  Obesity Weight is down 3 pounds since his last visit. Encouraged to continue to work on diet and exercise.   Orders Placed This Encounter  Procedures  . Basic Metabolic Panel (BMET)    Marikay Alar, MD [facility]

## 2016-06-04 NOTE — Patient Instructions (Signed)
Nice to meet you. Please continue your current blood pressure medication. Please check your blood pressure at home several days a week. If it is persistently greater than 140/90 please let us know so we can increase your medication. Please continue to work on diet and exercise. Please try to obtain a lab results from your work physical from January.

## 2016-06-04 NOTE — Progress Notes (Signed)
Pre visit review using our clinic review tool, if applicable. No additional management support is needed unless otherwise documented below in the visit note. 

## 2016-06-04 NOTE — Assessment & Plan Note (Signed)
At goal. Continue current medication. Continue to work on diet and exercise.

## 2016-08-24 ENCOUNTER — Ambulatory Visit (INDEPENDENT_AMBULATORY_CARE_PROVIDER_SITE_OTHER): Payer: BLUE CROSS/BLUE SHIELD | Admitting: Family Medicine

## 2016-08-24 ENCOUNTER — Encounter: Payer: Self-pay | Admitting: Family Medicine

## 2016-08-24 DIAGNOSIS — Z87892 Personal history of anaphylaxis: Secondary | ICD-10-CM

## 2016-08-24 DIAGNOSIS — I10 Essential (primary) hypertension: Secondary | ICD-10-CM

## 2016-08-24 MED ORDER — EPINEPHRINE 0.3 MG/0.3ML IJ SOAJ: 0.3000 mg | INTRAMUSCULAR | 0 refills | Status: DC | PRN

## 2016-08-24 MED ORDER — LOSARTAN POTASSIUM-HCTZ 50-12.5 MG PO TABS: 1.0000 | ORAL_TABLET | Freq: Every day | ORAL | 3 refills | Status: DC

## 2016-08-24 NOTE — Assessment & Plan Note (Signed)
Not at goal today though patient has not slept in 48 hours or taken his medication this morning. Advised to take his medication when he gets home this morning. He will return in 1-2 weeks for a nurse visit for blood pressure check when he has gotten some sleep. If not at goal at that time we will discuss adding additional medication.

## 2016-08-24 NOTE — Progress Notes (Signed)
  Shawn AlarEric Trevone Prestwood, MD Phone: [phone]  Shawn English is a 40 y.o. male who presents today for follow-up.  HYPERTENSION  Disease Monitoring  Home BP Monitoring to 120 to 130 over 70s Chest pain- no    Dyspnea- no Medications  Compliance-  taking losartan, HCTZ.   Edema- no Patient reports he has been awake for the last 48 hours at work. He also reports he has not taken his medication this morning.  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 Can have anaphylactic symptoms with Fish derived products and penicillins.  ROS see history of present illness  Objective  Physical Exam Vitals:   08/24/16 0759  BP: (!) 164/92  Pulse: 91  Temp: 98.6 F (37 C)    BP Readings from Last 3 Encounters:  08/24/16 (!) 164/92  06/04/16 136/88  02/27/16 (!) 147/96   Wt Readings from Last 3 Encounters:  08/24/16 201 lb 3.2 oz (91.3 kg)  06/04/16 203 lb 6.4 oz (92.3 kg)  02/27/16 206 lb 8 oz (93.7 kg)    Physical Exam  Constitutional: He is well-developed, well-nourished, and in no distress.  Cardiovascular: Normal rate, regular rhythm and normal heart sounds.   Pulmonary/Chest: Effort normal and breath sounds normal.  Musculoskeletal: He exhibits no edema.  Neurological: He is alert. Gait normal.  Skin: Skin is warm and dry.     Assessment/Plan: Please see individual problem list.  Essential hypertension Not at goal today though patient has not slept in 48 hours or taken his medication this morning. Advised to take his medication when he gets home this morning. He will return in 1-2 weeks for a nurse visit for blood pressure check when he has gotten some sleep. If not at goal at that time we will discuss adding additional medication.  History of anaphylaxis Patient reports his EpiPen recently expired. We'll give him a refill of 2 devices to have one with him and one at home. Advised that if he has to use  these at any point he needs to go to the emergency room.   No orders of the defined types were placed in this encounter.   Meds ordered this encounter  Medications  . losartan-hydrochlorothiazide (HYZAAR) 50-12.5 MG tablet    Sig: Take 1 tablet by mouth daily.    Dispense:  90 tablet    Refill:  3  . EPINEPHrine 0.3 mg/0.3 mL IJ SOAJ injection    Sig: Inject 0.3 mLs (0.3 mg total) into the muscle as needed.    Dispense:  2 Device    Refill:  0    Shawn AlarEric Ennis Heavner, MD [facility] [place]

## 2016-08-24 NOTE — Assessment & Plan Note (Signed)
Patient reports his EpiPen recently expired. We'll give him a refill of 2 devices to have one with him and one at home. Advised that if he has to use these at any point he needs to go to the emergency room.

## 2016-08-24 NOTE — Patient Instructions (Signed)
Nice to see you. We will have you come back in 1-2 weeks for nurse check of your blood pressure. Please take your blood pressure medication when you get home. If you have to use your EpiPen you need to go to the emergency room immediately. If you develop chest pain, shortness of breath, or any new or changing symptoms please seek medical attention medially.

## 2016-08-24 NOTE — Progress Notes (Signed)
Pre visit review using our clinic review tool, if applicable. No additional management support is needed unless otherwise documented below in the visit note. 

## 2016-09-07 ENCOUNTER — Ambulatory Visit (INDEPENDENT_AMBULATORY_CARE_PROVIDER_SITE_OTHER): Payer: BLUE CROSS/BLUE SHIELD

## 2016-09-07 VITALS — BP 118/88 | HR 86

## 2016-09-07 DIAGNOSIS — I10 Essential (primary) hypertension: Secondary | ICD-10-CM

## 2016-09-07 NOTE — Progress Notes (Signed)
Patient presents for follow up blood pressure check from visit on 08/24/2016 due to he had not taken medications and blood pressure was elevated.   Patient reports he has been taking medications daily.

## 2016-09-08 NOTE — Progress Notes (Signed)
Blood pressure at goal. Continue current management.  Marikay AlarEric Hugh Garrow, M.D.

## 2016-11-20 ENCOUNTER — Encounter: Payer: Self-pay | Admitting: Family Medicine

## 2016-11-21 LAB — CBC AND DIFFERENTIAL
HCT: 48 % (ref 41–53)
HEMOGLOBIN: 15.6 g/dL (ref 13.5–17.5)
Neutrophils Absolute: 5 /uL
Platelets: 228 10*3/uL (ref 150–399)

## 2016-11-21 LAB — BASIC METABOLIC PANEL
BUN: 14 mg/dL (ref 4–21)
CREATININE: 1 mg/dL (ref 0.6–1.3)
Glucose: 95 mg/dL
POTASSIUM: 3.9 mmol/L (ref 3.4–5.3)
Sodium: 140 mmol/L (ref 137–147)

## 2016-11-21 LAB — LIPID PANEL
Cholesterol: 216 mg/dL — AB (ref 0–200)
HDL: 47 mg/dL (ref 35–70)
LDL Cholesterol: 141 mg/dL
Triglycerides: 140 mg/dL (ref 40–160)

## 2016-11-21 LAB — HEPATIC FUNCTION PANEL
ALK PHOS: 74 U/L (ref 25–125)
ALT: 51 U/L — AB (ref 10–40)
AST: 28 U/L (ref 14–40)
Bilirubin, Total: 0.9 mg/dL

## 2016-11-21 LAB — TSH: TSH: 2.36 u[IU]/mL (ref 0.41–5.90)

## 2016-11-24 ENCOUNTER — Encounter: Payer: Self-pay | Admitting: Family Medicine

## 2016-11-24 ENCOUNTER — Ambulatory Visit (INDEPENDENT_AMBULATORY_CARE_PROVIDER_SITE_OTHER): Payer: BLUE CROSS/BLUE SHIELD | Admitting: Family Medicine

## 2016-11-24 VITALS — BP 144/92 | HR 89 | Temp 98.5°F | Wt 199.8 lb

## 2016-11-24 DIAGNOSIS — E6609 Other obesity due to excess calories: Secondary | ICD-10-CM

## 2016-11-24 DIAGNOSIS — I10 Essential (primary) hypertension: Secondary | ICD-10-CM | POA: Diagnosis not present

## 2016-11-24 DIAGNOSIS — Z6832 Body mass index (BMI) 32.0-32.9, adult: Secondary | ICD-10-CM | POA: Diagnosis not present

## 2016-11-24 MED ORDER — LOSARTAN POTASSIUM 50 MG PO TABS: 50.0000 mg | ORAL_TABLET | Freq: Every day | ORAL | 3 refills | Status: DC

## 2016-11-24 MED ORDER — HYDROCHLOROTHIAZIDE 25 MG PO TABS: 25.0000 mg | ORAL_TABLET | Freq: Every day | ORAL | 3 refills | Status: DC

## 2016-11-24 NOTE — Assessment & Plan Note (Signed)
Down 2 additional pounds. Discussed continuing diet changes. Encouraged to get back to exercising.

## 2016-11-24 NOTE — Patient Instructions (Signed)
Nice to see you. We are going to increase the dose of your hydrochlorothiazide. You're going to be on 2 separate pills at this time. We will have you return in a month for recheck your blood pressure and lab work. Please continue to work on diet and exercise as well.

## 2016-11-24 NOTE — Progress Notes (Signed)
  Marikay AlarEric Stephaine Breshears, MD Phone: [phone]  Shawn English is a 41 y.o. male who presents today for follow-up.  Hypertension: Checking at home and typically in the 135/86 range. Taking losartan HCTZ 50-12.5 mg. No chest pain, shortness breath, or edema.  Has been working on diet changes and is eating veggie heavy diet. Lots of soy and beans and carrots and zucchini. Eating chicken for protein. Staying away from processed foods. Has gotten away from exercise over the holidays in the cold weather.  ROS see history of present illness  Objective  Physical Exam Vitals:   11/24/16 0807 11/24/16 0824  BP: (!) 158/98 (!) 144/92  Pulse: 89   Temp: 98.5 F (36.9 C)     BP Readings from Last 3 Encounters:  11/24/16 (!) 144/92  09/07/16 118/88  08/24/16 (!) 164/92   Wt Readings from Last 3 Encounters:  11/24/16 199 lb 12.8 oz (90.6 kg)  08/24/16 201 lb 3.2 oz (91.3 kg)  06/04/16 203 lb 6.4 oz (92.3 kg)    Physical Exam  Constitutional: He is well-developed, well-nourished, and in no distress.  Cardiovascular: Normal rate, regular rhythm and normal heart sounds.   Pulmonary/Chest: Effort normal and breath sounds normal.  Musculoskeletal: He exhibits no edema.     Assessment/Plan: Please see individual problem list.  Essential hypertension Elevated in the office and at home. We will increase hydrochlorothiazide to 25 mg daily. Continue losartan 50 mg daily. These will be 2 separate pills moving forward. Patient had lab work drawn 4 days ago through work and he will have them fax this to us. He'll return in one month for recheck and repeat lab work.  Obesity Down 2 additional pounds. Discussed continuing diet changes. Encouraged to get back to exercising.   No orders of the defined types were placed in this encounter.   Meds ordered this encounter  Medications  . losartan (COZAAR) 50 MG tablet    Sig: Take 1 tablet (50 mg total) by mouth daily.    Dispense:  90 tablet    Refill:   3  . hydrochlorothiazide (HYDRODIURIL) 25 MG tablet    Sig: Take 1 tablet (25 mg total) by mouth daily.    Dispense:  90 tablet    Refill:  3   Marikay AlarEric Carlis Burnsworth, MD [facility] [place]

## 2016-11-24 NOTE — Assessment & Plan Note (Signed)
Elevated in the office and at home. We will increase hydrochlorothiazide to 25 mg daily. Continue losartan 50 mg daily. These will be 2 separate pills moving forward. Patient had lab work drawn 4 days ago through work and he will have them fax this to us. He'll return in one month for recheck and repeat lab work.

## 2016-11-24 NOTE — Progress Notes (Signed)
Pre visit review using our clinic review tool, if applicable. No additional management support is needed unless otherwise documented below in the visit note. 

## 2016-12-01 ENCOUNTER — Encounter: Payer: Self-pay | Admitting: Family Medicine

## 2016-12-07 ENCOUNTER — Telehealth: Payer: Self-pay

## 2016-12-07 NOTE — Telephone Encounter (Signed)
Patient notified and will have labs done at his [facility]

## 2016-12-07 NOTE — Telephone Encounter (Signed)
Left message to return call 

## 2016-12-07 NOTE — Telephone Encounter (Signed)
Left message to return call regarding lab results received from [facility], per Dr Birdie SonsSonnenberg, lfts are slightly elevated, needs to have rechecked in 2-3 weeks and work on diet and exercise for cholesterol

## 2016-12-07 NOTE — Telephone Encounter (Signed)
Pt called returning your call. Thank you!  Call pt @ [phone]

## 2016-12-17 ENCOUNTER — Telehealth: Payer: Self-pay | Admitting: *Deleted

## 2016-12-17 NOTE — Telephone Encounter (Signed)
Please advise 

## 2016-12-17 NOTE — Telephone Encounter (Signed)
Please contact the patient and see the extent of his symptoms. Those medications do not typically cause back pain radiating to the knees. If this is recurring we should evaluate him in the office. Lightheadedness may be from his blood pressure being low. Please find out what his blood pressures have been running. Thanks.

## 2016-12-17 NOTE — Telephone Encounter (Signed)
Patient notified and states it is a burning pain that started Sunday. Informed patient that he should be evaluated either at urgent care or walk in. Patient states he is unable to check his blood pressure due to his cuff being broken. He states he will get a new one.

## 2016-12-17 NOTE — Telephone Encounter (Signed)
Pt was advised to call the office if he had adverse with the change in medications, he currently is taking losartan and hydrochlorothiazide. Pt reported side effect of burning sensation starting at mid back radiating to his knees. He also experience insomnia and a few seconds of dizziness once. Pt requested to be called  Pt contact [phone]

## 2016-12-18 NOTE — Telephone Encounter (Signed)
Noted. Agree with eval.  

## 2016-12-25 ENCOUNTER — Ambulatory Visit: Payer: BLUE CROSS/BLUE SHIELD | Admitting: Family Medicine

## 2016-12-31 ENCOUNTER — Encounter: Payer: Self-pay | Admitting: Family Medicine

## 2016-12-31 ENCOUNTER — Ambulatory Visit (INDEPENDENT_AMBULATORY_CARE_PROVIDER_SITE_OTHER): Payer: BLUE CROSS/BLUE SHIELD | Admitting: Family Medicine

## 2016-12-31 VITALS — BP 140/90 | HR 94 | Temp 98.4°F | Wt 194.0 lb

## 2016-12-31 DIAGNOSIS — I10 Essential (primary) hypertension: Secondary | ICD-10-CM | POA: Diagnosis not present

## 2016-12-31 DIAGNOSIS — Z6832 Body mass index (BMI) 32.0-32.9, adult: Secondary | ICD-10-CM

## 2016-12-31 DIAGNOSIS — E6609 Other obesity due to excess calories: Secondary | ICD-10-CM

## 2016-12-31 DIAGNOSIS — R238 Other skin changes: Secondary | ICD-10-CM | POA: Insufficient documentation

## 2016-12-31 LAB — BASIC METABOLIC PANEL
BUN: 14 mg/dL (ref 6–23)
CHLORIDE: 97 meq/L (ref 96–112)
CO2: 31 mEq/L (ref 19–32)
Calcium: 10.2 mg/dL (ref 8.4–10.5)
Creatinine, Ser: 0.99 mg/dL (ref 0.40–1.50)
GFR: 89.59 mL/min (ref >60.00)
Glucose, Bld: 96 mg/dL (ref 70–99)
POTASSIUM: 3.5 meq/L (ref 3.5–5.1)
SODIUM: 136 meq/L (ref 135–145)

## 2016-12-31 MED ORDER — LOSARTAN POTASSIUM 100 MG PO TABS: 100.0000 mg | ORAL_TABLET | Freq: Every day | ORAL | 3 refills | Status: DC

## 2016-12-31 NOTE — Assessment & Plan Note (Signed)
Not a goal. We'll increase his losartan. We will continue hydrochlorothiazide. We'll check a BMP today and a BMP in 1 week. He will continue to monitor his blood pressure at home.

## 2016-12-31 NOTE — Assessment & Plan Note (Signed)
Down 5 pounds. Encouraged exercise. He'll continue to work on diet.

## 2016-12-31 NOTE — Assessment & Plan Note (Signed)
Patient with an episode of skin sensitivity and burning sensation previously. Has resolved at this time. Has not recurred. Occurred several weeks after changing the dose of HCTZ thus I doubt it is related to the HCTZ particularly since it has resolved. Potentially could've been related to a viral illness. Discussed with patient monitoring for any recurrence. I did discuss the option of discontinuing hydrochlorothiazide though we both thought that it was likely not related to this and he would continue this for now. If has recurrence of this sensation in his skin could consider discontinuing hydrochlorothiazide.

## 2016-12-31 NOTE — Progress Notes (Signed)
  Shawn AlarEric Psalm Arman, MD Phone: [phone]  Shawn English is a 41 y.o. male who presents today for follow-up  Hypertension: Has been checking at home and typically in the 130s-140s/100s. Has been taking hydrochlorothiazide and losartan. No chest pain, shortness breath, or edema.   Has altered his diet. He is eating more salads, whole grains, and tofu. Decreasing excess meat. He is down about 5 pounds. He plans to start exercising.  Patient notes several weeks ago he felt like his skin was on fire to touch from his sternum down to his knees. Is very sensitive to touch though had no rash. He took some Aleve and Tylenol and this took the edge off. It went away on its own after about a week. Has not recurred. No anaphylaxis symptoms.  PMH: Former smoker   ROS see history of present illness  Objective  Physical Exam Vitals:   12/31/16 0825  BP: 140/90  Pulse: 94  Temp: 98.4 F (36.9 C)    BP Readings from Last 3 Encounters:  12/31/16 140/90  11/24/16 (!) 144/92  09/07/16 118/88   Wt Readings from Last 3 Encounters:  12/31/16 194 lb (88 kg)  11/24/16 199 lb 12.8 oz (90.6 kg)  08/24/16 201 lb 3.2 oz (91.3 kg)    Physical Exam  Constitutional: No distress.  Cardiovascular: Normal rate, regular rhythm and normal heart sounds.   Pulmonary/Chest: Effort normal and breath sounds normal.  Musculoskeletal: He exhibits no edema.  Skin: Skin is warm and dry. He is not diaphoretic.  No rash noted on abdomen him and no skin sensitivity over his abdomen     Assessment/Plan: Please see individual problem list.  Essential hypertension Not a goal. We'll increase his losartan. We will continue hydrochlorothiazide. We'll check a BMP today and a BMP in 1 week. He will continue to monitor his blood pressure at home.  Obesity Down 5 pounds. Encouraged exercise. He'll continue to work on diet.  Skin sensitivity Patient with an episode of skin sensitivity and burning sensation previously. Has  resolved at this time. Has not recurred. Occurred several weeks after changing the dose of HCTZ thus I doubt it is related to the HCTZ particularly since it has resolved. Potentially could've been related to a viral illness. Discussed with patient monitoring for any recurrence. I did discuss the option of discontinuing hydrochlorothiazide though we both thought that it was likely not related to this and he would continue this for now. If has recurrence of this sensation in his skin could consider discontinuing hydrochlorothiazide.   Orders Placed This Encounter  Procedures  . Basic Metabolic Panel (BMET)    Meds ordered this encounter  Medications  . losartan (COZAAR) 100 MG tablet    Sig: Take 1 tablet (100 mg total) by mouth daily.    Dispense:  90 tablet    Refill:  3    Shawn AlarEric Bonnie Roig, MD [facility] [place]

## 2016-12-31 NOTE — Progress Notes (Signed)
Pre visit review using our clinic review tool, if applicable. No additional management support is needed unless otherwise documented below in the visit note. 

## 2016-12-31 NOTE — Patient Instructions (Signed)
Nice to see you. Please continue the hydrochlorothiazide. We will increase your losartan to 100 mg. We'll check some lab work today. He will follow-up in a week for repeat lab work in a month with nursing for blood pressure check. Monitor the burning skin sensation you had and if it recurs please let us know.

## 2017-01-04 ENCOUNTER — Other Ambulatory Visit: Payer: BLUE CROSS/BLUE SHIELD

## 2017-01-11 ENCOUNTER — Other Ambulatory Visit (INDEPENDENT_AMBULATORY_CARE_PROVIDER_SITE_OTHER): Payer: BLUE CROSS/BLUE SHIELD

## 2017-01-11 DIAGNOSIS — I10 Essential (primary) hypertension: Secondary | ICD-10-CM | POA: Diagnosis not present

## 2017-01-11 LAB — BASIC METABOLIC PANEL
BUN: 16 mg/dL (ref 6–23)
CALCIUM: 10.1 mg/dL (ref 8.4–10.5)
CO2: 29 meq/L (ref 19–32)
Chloride: 101 mEq/L (ref 96–112)
Creatinine, Ser: 0.99 mg/dL (ref 0.40–1.50)
GFR: 89.57 mL/min (ref >60.00)
GLUCOSE: 82 mg/dL (ref 70–99)
Potassium: 3.5 mEq/L (ref 3.5–5.1)
Sodium: 140 mEq/L (ref 135–145)

## 2017-01-28 ENCOUNTER — Encounter: Payer: Self-pay | Admitting: Family Medicine

## 2017-01-28 ENCOUNTER — Ambulatory Visit (INDEPENDENT_AMBULATORY_CARE_PROVIDER_SITE_OTHER): Payer: BLUE CROSS/BLUE SHIELD

## 2017-01-28 VITALS — BP 130/80 | HR 99 | Resp 20

## 2017-01-28 DIAGNOSIS — I10 Essential (primary) hypertension: Secondary | ICD-10-CM | POA: Diagnosis not present

## 2017-01-28 NOTE — Progress Notes (Signed)
Patient was seen approximately one month ago.  BP was high at that appt. Asked to return in one month for recheck after increasing his losartan to 100mg .  Patient has been taking new dose with no issues. See vitals for details.  Any changes can be sent via mychart to patient. Thanks

## 2017-01-28 NOTE — Progress Notes (Signed)
BP well controlled. No changes to be made. Message sent to patient.

## 2017-06-08 ENCOUNTER — Other Ambulatory Visit: Payer: Self-pay | Admitting: Family Medicine

## 2017-06-08 NOTE — Telephone Encounter (Signed)
Refill sent to pharmacy. Please get patient set up for a follow-up appointment. Thanks.

## 2017-06-08 NOTE — Telephone Encounter (Signed)
Patient was last seen on 01/28/2017. Your note stated that he needed to be seen for a follow up in 3 months to recheck. No upcoming appointment scheduled. Is it ok to refill?

## 2017-06-08 NOTE — Telephone Encounter (Signed)
Please schedule patient within the next 30 days for follow up with Dr. Birdie SonsSonnenberg.

## 2017-06-09 NOTE — Telephone Encounter (Signed)
Thank you :)

## 2017-06-09 NOTE — Telephone Encounter (Signed)
Pt has been scheduled.  °

## 2017-07-06 ENCOUNTER — Ambulatory Visit: Payer: BLUE CROSS/BLUE SHIELD | Admitting: Family Medicine

## 2017-08-12 NOTE — Progress Notes (Signed)
[name] [address] [place], [place] [address] Phone: [phone] Subjective:    I'm seeing this patient by the request  of:  Glori Luis, MD   CC: Shoulder pain  BJY:[id number]  Shawn English is a 41 y.o. male coming in with complaint of right shoulder pain. Patient explains he was doing some yard work during labor day weekend when he picked up a bag of potting soil. Felt the pain the next morning. He feels numbness and tingling down his arm. Patient is bracing his arm by his side.   Onset- a month Location- Posterior lateral, armpit  Duration- Worse in the evening Character- Constant burn. Some pinching depending on the movement. Aggravating factors- Sleeping, movement  Reliving factors- Ice every night for a month Therapies tried-  Severity- At its worse 8     Past Medical History:  Diagnosis Date  . Chicken pox   . Hypertension    Past Surgical History:  Procedure Laterality Date  . CYST REMOVAL HAND  2009   Benign cyst removed from right arm   Social History   Social History  . Marital status: Married    Spouse name: N/A  . Number of children: N/A  . Years of education: N/A   Social History Main Topics  . Smoking status: Former Smoker    Types: Cigarettes    Quit date: 09/09/2014  . Smokeless tobacco: Never Used  . Alcohol use 0.6 oz/week    1 Standard drinks or equivalent per week     Comment: Occasional use   . Drug use: No  . Sexual activity: Yes    Partners: Female     Comment: Fiance   Other Topics Concern  . None   Social History Narrative   Lives with fiance    Child on the way, girl, due in July 2016    No Pets    Work at [employer]    Left handed   Caffeine- 2 cups of coffee, occasional soda    Enjoys playing guitar and traveling       Allergies  Allergen Reactions  . Bee Venom Swelling  . Fish-Derived Products Anaphylaxis    Fish oil included  . Penicillins Anaphylaxis    . Sulfa Antibiotics Nausea And Vomiting   Family History  Problem Relation Age of Onset  . Hypertension Father   . Cancer Maternal Grandmother        breast     Past medical history, social, surgical and family history all reviewed in electronic medical record.  No pertanent information unless stated regarding to the chief complaint.   Review of Systems:Review of systems updated and as accurate as of 08/13/17  No headache, visual changes, nausea, vomiting, diarrhea, constipation, dizziness, abdominal pain, skin rash, fevers, chills, night sweats, weight loss, swollen lymph nodes, body aches, joint swelling, muscle aches, chest pain, shortness of breath, mood changes.   Objective  Blood pressure (!) 164/80, pulse 99, height  (1.676 m), weight 202 lb (91.6 kg), SpO2 98 %. Systems examined below as of 08/13/17   General: No apparent distress alert and oriented x3 mood and affect normal, dressed appropriately.  HEENT: Pupils equal, extraocular movements intact  Respiratory: Patient's speak in full sentences and does not appear short of breath  Cardiovascular: No lower extremity edema, non tender, no erythema  Skin: Warm dry intact with no signs of infection or rash on extremities or on axial  skeleton.  Abdomen: Soft nontender  Neuro: Cranial nerves II through XII are intact, neurovascularly intact in all extremities with 2+ DTRs and 2+ pulses.  Lymph: No lymphadenopathy of posterior or anterior cervical chain or axillae bilaterally.  Gait normal with good balance and coordination.  MSK:  Non tender with full range of motion and good stability and symmetric strength and tone of  elbows, wrist, hip, knee and ankles bilaterally.   Neck: Inspection unremarkable. No palpable stepoffs. Negative Spurling's maneuver. Positive Spurling's with radicular symptoms in the C8 distribution.  4 out of 5 strength with grip strength compared to contralateral side No sensory change to C4 to  T1 Negative Hoffman sign bilaterally Reflexes normal Shoulder: Right Inspection reveals no abnormalities, atrophy or asymmetry. Possibly small amount of swelling compared to the contralateral side Palpation is normal with no tenderness over AC joint or bicipital groove. ROM is full in all planes. Rotator cuff strength normal throughout. Mild impingement Speeds and Yergason's tests normal. No labral pathology noted with negative Obrien's, negative clunk and good stability. Normal scapular function observed. No painful arc and no drop arm sign. No apprehension sign The shoulder is unremarkable  MSK US performed of:  This study was ordered, performed, and interpreted by Terrilee Files D.O.  Shoulder:   Supraspinatus:  Appears normal on long and transverse views,  Infraspinatus:  Appears normal on long and transverse views. Subscapularis:  Appears normal on long and transverse views. Teres Minor:  Appears normal on long and transverse views. AC joint:  Very mild arthritis Glenohumeral Joint:  Appears normal without effusion. Glenoid Labrum:  Intact without visualized tears. Biceps Tendon:  Appears normal on long and transverse views, no fraying of tendon, tendon located in intertubercular groove, no subluxation with shoulder internal or external rotation. No increased power doppler signal. Impression: normal     Impression and Recommendations:     This case required medical decision making of moderate complexity.      Note: This dictation was prepared with Dragon dictation along with smaller phrase technology. Any transcriptional errors that result from this process are unintentional.

## 2017-08-13 ENCOUNTER — Ambulatory Visit (INDEPENDENT_AMBULATORY_CARE_PROVIDER_SITE_OTHER): Payer: BLUE CROSS/BLUE SHIELD | Admitting: Family Medicine

## 2017-08-13 ENCOUNTER — Ambulatory Visit (INDEPENDENT_AMBULATORY_CARE_PROVIDER_SITE_OTHER)
Admission: RE | Admit: 2017-08-13 | Discharge: 2017-08-13 | Disposition: A | Discharge status: Home / Self Care | Payer: BLUE CROSS/BLUE SHIELD | Source: Ambulatory Visit | Attending: Family Medicine | Admitting: Family Medicine

## 2017-08-13 ENCOUNTER — Encounter: Payer: Self-pay | Admitting: Family Medicine

## 2017-08-13 ENCOUNTER — Ambulatory Visit: Payer: Self-pay

## 2017-08-13 VITALS — BP 164/80 | HR 99 | Ht 66.0 in | Wt 202.0 lb

## 2017-08-13 DIAGNOSIS — M5412 Radiculopathy, cervical region: Secondary | ICD-10-CM | POA: Diagnosis not present

## 2017-08-13 DIAGNOSIS — G8929 Other chronic pain: Secondary | ICD-10-CM | POA: Diagnosis not present

## 2017-08-13 DIAGNOSIS — M25511 Pain in right shoulder: Secondary | ICD-10-CM

## 2017-08-13 DIAGNOSIS — M4802 Spinal stenosis, cervical region: Secondary | ICD-10-CM | POA: Diagnosis not present

## 2017-08-13 MED ORDER — GABAPENTIN 100 MG PO CAPS: 200.0000 mg | ORAL_CAPSULE | Freq: Every day | ORAL | 3 refills | Status: DC

## 2017-08-13 MED ORDER — PREDNISONE 50 MG PO TABS: 50.0000 mg | ORAL_TABLET | Freq: Every day | ORAL | 0 refills | Status: DC

## 2017-08-13 NOTE — Patient Instructions (Addendum)
Good to see you  Ice 20 minutes 2 times daily. Usually after activity and before bed. Xray downstairs today  Prednisone daily for 5 days Gabapentin  at night Avoid heavy lifting if you can  I need to see you again in 1 week and make sure you are batter (next 6-7 days)

## 2017-08-13 NOTE — Assessment & Plan Note (Signed)
Patient does have radicular symptoms and seems to be on the right side. Seems to be in the C8 distribution. No significant weakness but patient does have chronic numbness. Patient and prednisone and gabapentin. X-rays the neck ordered. Worsening symptoms patient knows when to seek medical attention regarding to the emergency room. Patient follow-up with me again in 1 week and hopefully we'll advance accordingly.

## 2017-08-20 ENCOUNTER — Ambulatory Visit (INDEPENDENT_AMBULATORY_CARE_PROVIDER_SITE_OTHER): Payer: BLUE CROSS/BLUE SHIELD | Admitting: Family Medicine

## 2017-08-20 ENCOUNTER — Encounter: Payer: Self-pay | Admitting: Family Medicine

## 2017-08-20 DIAGNOSIS — M5412 Radiculopathy, cervical region: Secondary | ICD-10-CM

## 2017-08-20 MED ORDER — PREDNISONE 50 MG PO TABS: 50.0000 mg | ORAL_TABLET | Freq: Every day | ORAL | 0 refills | Status: DC

## 2017-08-20 MED ORDER — TRAMADOL HCL 50 MG PO TABS: 50.0000 mg | ORAL_TABLET | Freq: Two times a day (BID) | ORAL | 0 refills | Status: DC | PRN

## 2017-08-20 MED ORDER — TIZANIDINE HCL 4 MG PO TABS: 4.0000 mg | ORAL_TABLET | Freq: Four times a day (QID) | ORAL | 0 refills | Status: DC | PRN

## 2017-08-20 NOTE — Progress Notes (Signed)
[name] [address] [place], [place] [address] Phone: [phone] Subjective:     CC: Neck pain follow-up  [id number]  Shawn English is a 41 y.o. male coming in with complaint of neck pain. Patient was seen previously and was diagnosed with more of a cervical radiculopathy with some mild weakness. Patient was started on gabapentin prednisone. Patient states Doing a proximal only 80% better. States that the radicular pain as well as the weakness than he had previously has improved. States that when he is on the prednisone was doing significantly better now minorly worse again.   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  Past Medical History:  Diagnosis Date  . Chicken pox   . Hypertension    Past Surgical History:  Procedure Laterality Date  . CYST REMOVAL HAND  2009   Benign cyst removed from right arm   Social History   Social History  . Marital status: Married    Spouse name: N/A  . Number of children: N/A  . Years of education: N/A   Social History Main Topics  . Smoking status: Former Smoker    Types: Cigarettes    Quit date: 09/09/2014  . Smokeless tobacco: Never Used  . Alcohol use 0.6 oz/week    1 Standard drinks or equivalent per week     Comment: Occasional use   . Drug use: No  . Sexual activity: Yes    Partners: Female     Comment: Fiance   Other Topics Concern  . None   Social History Narrative   Lives with fiance    Child on the way, girl, due in July 2016    No Pets    Work at [employer]    Left handed   Caffeine- 2 cups of coffee, occasional soda    Enjoys playing guitar and traveling       Allergies  Allergen Reactions  . Bee Venom Swelling  . Fish-Derived Products Anaphylaxis    Fish oil included  . Penicillins Anaphylaxis  . Sulfa Antibiotics Nausea And  Vomiting   Family History  Problem Relation Age of Onset  . Hypertension Father   . Cancer Maternal Grandmother        breast     Past medical history, social, surgical and family history all reviewed in electronic medical record.  No pertanent information unless stated regarding to the chief complaint.   Review of Systems:Review of systems updated and as accurate as of 08/20/17  No headache, visual changes, nausea, vomiting, diarrhea, constipation, dizziness, abdominal pain, skin rash, fevers, chills, night sweats, weight loss, swollen lymph nodes, body aches, joint swelling, chest pain, shortness of breath, mood changes. Positive muscle aches  Objective  Blood pressure 134/88, pulse 90, height  (1.676 m), weight 202 lb (91.6 kg), SpO2 98 %. Systems examined below as of 08/20/17   General: No apparent distress alert and oriented x3 mood and affect normal, dressed appropriately.  HEENT: Pupils equal, extraocular movements intact  Respiratory: Patient's speak in full sentences and does not appear short of breath  Cardiovascular: No lower extremity edema, non tender, no erythema  Skin: Warm dry intact with no signs of infection or rash on extremities or on axial skeleton.  Abdomen: Soft nontender  Neuro: Cranial nerves II through XII are intact, neurovascularly intact in all extremities with 2+ DTRs  and 2+ pulses.  Lymph: No lymphadenopathy of posterior or anterior cervical chain or axillae bilaterally.  Gait normal with good balance and coordination.  MSK:  Non tender with full range of motion and good stability and symmetric strength and tone of shoulders, elbows, wrist, hip, knee and ankles bilaterally.  Neck: Inspection mild loss of lordosis. No palpable stepoffs. Still mildly positive Spurling's maneuver right radicular symptoms Full neck range of motion Grip strength and sensation normal in bilateral hands Strength good C4 to T1 distribution No sensory change to C4 to  T1 Negative Hoffman sign bilaterally Reflexes normal  40981; 15 additional minutes spent for Therapeutic exercises as stated in above notes.  This included exercises focusing on stretching, strengthening, with significant focus on eccentric aspects.   Long term goals include an improvement in range of motion, strength, endurance as well as avoiding reinjury. Patient's frequency would include in 1-2 times a day, 3-5 times a week for a duration of 6-12 weeks. Exercises that included:  Basic scapular stabilization to include adduction and depression of scapula Scaption, focusing on proper movement and good control Internal and External rotation utilizing a theraband, with elbow tucked at side entire time Rows with theraband which was givne    Proper technique shown and discussed handout in great detail with ATC.  All questions were discussed and answered.      Impression and Recommendations:     This case required medical decision making of moderate complexity.      Note: This dictation was prepared with Dragon dictation along with smaller phrase technology. Any transcriptional errors that result from this process are unintentional.

## 2017-08-20 NOTE — Assessment & Plan Note (Signed)
Dicussed with patient patient will advance to home exercises and work with Event organiser. We discussed posture and ergonomics. We discussed worsening symptoms to repeat the prednisone with him traveling over the course the next 3 weeks. Patient will continue the gabapentin. Tramadol given for breakthrough pain as well as muscle relaxants. Worsening symptoms we'll need to consider formal physical therapy. If any weakness I do feel advance imaging such as an MRI would be needed his he would be a candidate for a epidural injection.

## 2017-08-20 NOTE — Patient Instructions (Addendum)
Good to see you  Safe travels  If a lot of pain Prednisone again for 5 days Gabapentin up to 3 pills at night Zanaflex up to 3 times a day but can make you feel funny but good for the flight Severe pain take tramadol up to 3 times a day  Exercises 3 times a week.  See me again in 3 weeks. Call me if worsening.

## 2017-08-21 ENCOUNTER — Other Ambulatory Visit: Payer: Self-pay | Admitting: Family Medicine

## 2017-09-11 NOTE — Progress Notes (Signed)
[facility] [address] [place], [place] [address] Phone: [phone] Subjective:     CC: Neck pain follow-up  BJY:NWGNFAOZHYHPI:Subjective  Shawn MeyerCarl Fooks is a 41 y.o. male coming in with complaint of neck and arm pain. Patient was previously seen and was concern for more of a cervical radiculopathy. Patient did have x-rays. These were independently visualized by me showing only mild osteoarthritic changes that seem to be diffuse. Patient though did have weakness of the upper extremity. Was making progress with the prednisone gabapentin started home exercises 3 weeks ago. Patient states doing approximately 80% better.  In stance including pain going down the hands is much more intermittent at this time.  Not having severe pain.     Past Medical History:  Diagnosis Date  . Chicken pox   . Hypertension    Past Surgical History:  Procedure Laterality Date  . CYST REMOVAL HAND  2009   Benign cyst removed from right arm   Social History   Socioeconomic History  . Marital status: Married    Spouse name: None  . Number of children: None  . Years of education: None  . Highest education level: None  Social Needs  . Financial resource strain: None  . Food insecurity - worry: None  . Food insecurity - inability: None  . Transportation needs - medical: None  . Transportation needs - non-medical: None  Occupational History  . None  Tobacco Use  . Smoking status: Former Smoker    Types: Cigarettes    Last attempt to quit: 09/09/2014    Years since quitting: 3.0  . Smokeless tobacco: Never Used  Substance and Sexual Activity  . Alcohol use: Yes    Alcohol/week: 0.6 oz    Types: 1 Standard drinks or equivalent per week    Comment: Occasional use   . Drug use: No  . Sexual activity: Yes    Partners: Female    Comment: Fiance  Other Topics Concern  . None  Social History Narrative   Lives with fiance    Child on the way, girl, due in July 2016    No Pets    Work at [employer]    College    Left handed   Caffeine- 2 cups of coffee, occasional soda    Enjoys playing guitar and traveling    Allergies  Allergen Reactions  . Bee Venom Swelling  . Fish-Derived Products Anaphylaxis    Fish oil included  . Penicillins Anaphylaxis  . Sulfa Antibiotics Nausea And Vomiting   Family History  Problem Relation Age of Onset  . Hypertension Father   . Cancer Maternal Grandmother        breast     Past medical history, social, surgical and family history all reviewed in electronic medical record.  No pertanent information unless stated regarding to the chief complaint.   Review of Systems:Review of systems updated and as accurate as of 09/13/17  No headache, visual changes, nausea, vomiting, diarrhea, constipation, dizziness, abdominal pain, skin rash, fevers, chills, night sweats, weight loss, swollen lymph nodes, body aches, joint swelling, muscle aches, chest pain, shortness of breath, mood changes.   Objective  Blood pressure 124/90, pulse 94, height 5\' 7"  (1.702 m), weight 204 lb (92.5 kg), SpO2 98 %. Systems examined below as of 09/13/17   General: No apparent distress alert and oriented x3 mood and affect normal, dressed appropriately.  HEENT: Pupils equal, extraocular movements intact  Respiratory: Patient's speak  in full sentences and does not appear short of breath  Cardiovascular: No lower extremity edema, non tender, no erythema  Skin: Warm dry intact with no signs of infection or rash on extremities or on axial skeleton.  Abdomen: Soft nontender  Neuro: Cranial nerves II through XII are intact, neurovascularly intact in all extremities with 2+ DTRs and 2+ pulses.  Lymph: No lymphadenopathy of posterior or anterior cervical chain or axillae bilaterally.  Gait normal with good balance and coordination.  MSK:  Non tender with full range of motion and good stability and symmetric strength and tone of shoulders, elbows, wrist,  hip, knee and ankles bilaterally.  Neck: Inspection unremarkable. No palpable stepoffs. Negative Spurling's maneuver.  Pain but no radicular symptoms The left and left 5-10 degrees of rotation bilaterally Grip strength and sensation normal in bilateral hands Strength good C4 to T1 distribution No sensory change to C4 to T1 Negative Hoffman sign bilaterally Reflexes normal   Impression and Recommendations:     This case required medical decision making of moderate complexity.      Note: This dictation was prepared with Dragon dictation along with smaller phrase technology. Any transcriptional errors that result from this process are unintentional.

## 2017-09-13 ENCOUNTER — Ambulatory Visit: Payer: BLUE CROSS/BLUE SHIELD | Admitting: Family Medicine

## 2017-09-13 ENCOUNTER — Encounter: Payer: Self-pay | Admitting: Family Medicine

## 2017-09-13 DIAGNOSIS — M5412 Radiculopathy, cervical region: Secondary | ICD-10-CM | POA: Diagnosis not present

## 2017-09-13 NOTE — Patient Instructions (Signed)
Good to see you  Shawn English is your friend.  Keep doing the exercises I am excited of the standing desk  Continue the gabapentin  See me again in 6 weeks.

## 2017-09-13 NOTE — Assessment & Plan Note (Signed)
Spent  25 minutes with patient face-to-face and had greater than 50% of counseling including as described in assessment and plan.  Discussed with patient in great length about the home exercises, ergonomic switch patient is getting a standard disc in the near future.  We discussed icing regimen, we discussed the gabapentin and taking this occasionally.  Patient declined formal physical therapy at this point.  Patient will continue with conservative therapy overall he declined physical therapy and will come back and see me again in 4-6 weeks.

## 2017-10-09 NOTE — Progress Notes (Signed)
[facility]  [facility] [address] [place], [place] [address] Phone: [phone] Subjective:    I'm seeing this patient by the request  of:    CC: neck pain follow up   BJY:NWGNFAOZHYHPI:Subjective  Shawn MeyerCarl English is a 41 y.o. male coming in with complaint of neck pain. Was having more cervical radiculopathy.  Patient was last seen 1 month ago and was to continue with conservative therapy.  Patient was taking gabapentin at night and was getting a standing desk.  Patient states doing 95% better at this time.  Patient denies any new symptoms.  States that the standing desk is been significantly helpful.  Doing the exercises fairly regularly.  No radiation or weakness.        Past Medical History:  Diagnosis Date  . Chicken pox   . Hypertension    Past Surgical History:  Procedure Laterality Date  . CYST REMOVAL HAND  2009   Benign cyst removed from right arm   Social History   Socioeconomic History  . Marital status: Married    Spouse name: Not on file  . Number of children: Not on file  . Years of education: Not on file  . Highest education level: Not on file  Social Needs  . Financial resource strain: Not on file  . Food insecurity - worry: Not on file  . Food insecurity - inability: Not on file  . Transportation needs - medical: Not on file  . Transportation needs - non-medical: Not on file  Occupational History  . Not on file  Tobacco Use  . Smoking status: Former Smoker    Types: Cigarettes    Last attempt to quit: 09/09/2014    Years since quitting: 3.0  . Smokeless tobacco: Never Used  Substance and Sexual Activity  . Alcohol use: Yes    Alcohol/week: 0.6 oz    Types: 1 Standard drinks or equivalent per week    Comment: Occasional use   . Drug use: No  . Sexual activity: Yes    Partners: Female    Comment: Fiance  Other Topics Concern  . Not on file  Social History Narrative   Lives with fiance    Child on the way, girl, due in July 2016    No Pets     Work at [employer]    College    Left handed   Caffeine- 2 cups of coffee, occasional soda    Enjoys playing guitar and traveling    Allergies  Allergen Reactions  . Bee Venom Swelling  . Fish-Derived Products Anaphylaxis    Fish oil included  . Penicillins Anaphylaxis  . Sulfa Antibiotics Nausea And Vomiting   Family History  Problem Relation Age of Onset  . Hypertension Father   . Cancer Maternal Grandmother        breast     Past medical history, social, surgical and family history all reviewed in electronic medical record.  No pertanent information unless stated regarding to the chief complaint.   Review of Systems:Review of systems updated and as accurate as of 10/09/17  No headache, visual changes, nausea, vomiting, diarrhea, constipation, dizziness, abdominal pain, skin rash, fevers, chills, night sweats, weight loss, swollen lymph nodes, body aches, joint swelling, muscle aches, chest pain, shortness of breath, mood changes.   Objective  There were no vitals taken for this visit. Systems examined below as of 10/09/17   General: No apparent distress alert and oriented x3 mood and affect  normal, dressed appropriately.  HEENT: Pupils equal, extraocular movements intact  Respiratory: Patient's speak in full sentences and does not appear short of breath  Cardiovascular: No lower extremity edema, non tender, no erythema  Skin: Warm dry intact with no signs of infection or rash on extremities or on axial skeleton.  Abdomen: Soft nontender  Neuro: Cranial nerves II through XII are intact, neurovascularly intact in all extremities with 2+ DTRs and 2+ pulses.  Lymph: No lymphadenopathy of posterior or anterior cervical chain or axillae bilaterally.  Gait normal with good balance and coordination.  MSK:  Non tender with full range of motion and good stability and symmetric strength and tone of shoulders, elbows, wrist, hip, knee and ankles bilaterally.   Neck: Inspection significant straightening noted. No palpable stepoffs. Negative Spurling's maneuver. Full neck range of motion Grip strength and sensation normal in bilateral hands Strength good C4 to T1 distribution No sensory change to C4 to T1 Negative Hoffman sign bilaterally Reflexes normal    Impression and Recommendations:     This case required medical decision making of moderate complexity.      Note: This dictation was prepared with Dragon dictation along with smaller phrase technology. Any transcriptional errors that result from this process are unintentional.

## 2017-10-11 ENCOUNTER — Ambulatory Visit: Payer: BLUE CROSS/BLUE SHIELD | Admitting: Family Medicine

## 2017-10-11 ENCOUNTER — Encounter: Payer: Self-pay | Admitting: Family Medicine

## 2017-10-11 DIAGNOSIS — M5412 Radiculopathy, cervical region: Secondary | ICD-10-CM | POA: Diagnosis not present

## 2017-10-11 NOTE — Patient Instructions (Signed)
Good to see you  You are doing great  Continue with the standup desk  Ice is your friend Gabapentin when you need it [phone] when you need me Happy holidays!

## 2017-10-11 NOTE — Assessment & Plan Note (Signed)
Significant improvement at this time.  Patient is almost back to his baseline.  We discussed continuing certain medications for some time but then can discontinue if necessary.  Follow-up with me again as needed

## 2017-10-21 ENCOUNTER — Other Ambulatory Visit: Payer: Self-pay

## 2017-10-21 ENCOUNTER — Encounter: Payer: Self-pay | Admitting: Family Medicine

## 2017-10-21 ENCOUNTER — Ambulatory Visit: Payer: BLUE CROSS/BLUE SHIELD | Admitting: Family Medicine

## 2017-10-21 VITALS — BP 130/90 | HR 94 | Temp 98.8°F | Wt 206.2 lb

## 2017-10-21 DIAGNOSIS — E6609 Other obesity due to excess calories: Secondary | ICD-10-CM

## 2017-10-21 DIAGNOSIS — I10 Essential (primary) hypertension: Secondary | ICD-10-CM

## 2017-10-21 DIAGNOSIS — M5412 Radiculopathy, cervical region: Secondary | ICD-10-CM | POA: Diagnosis not present

## 2017-10-21 DIAGNOSIS — Z6832 Body mass index (BMI) 32.0-32.9, adult: Secondary | ICD-10-CM | POA: Diagnosis not present

## 2017-10-21 LAB — BASIC METABOLIC PANEL
BUN: 13 mg/dL (ref 6–23)
CHLORIDE: 99 meq/L (ref 96–112)
CO2: 33 mEq/L — ABNORMAL HIGH (ref 19–32)
Calcium: 9.7 mg/dL (ref 8.4–10.5)
Creatinine, Ser: 0.98 mg/dL (ref 0.40–1.50)
GFR: 90.26 mL/min (ref >60.00)
Glucose, Bld: 90 mg/dL (ref 70–99)
POTASSIUM: 3.7 meq/L (ref 3.5–5.1)
Sodium: 139 mEq/L (ref 135–145)

## 2017-10-21 NOTE — Assessment & Plan Note (Signed)
Much improved.  He will continue his exercises and continue to monitor.  Follow-up with me or Dr. Katrinka BlazingSmith if he has recurrent issues.

## 2017-10-21 NOTE — Patient Instructions (Signed)
Nice to see you. Please work on diet and exercise as discussed. If your neck starts to bother you more please let us or Dr. Katrinka BlazingSmith know.

## 2017-10-21 NOTE — Assessment & Plan Note (Signed)
Patient needs to work on diet and exercise.

## 2017-10-21 NOTE — Assessment & Plan Note (Signed)
Borderline diastolic control.  I discussed adding another medication though the patient is hesitant to do this.  He will continue to monitor.  Discussed working on diet and exercise.

## 2017-10-21 NOTE — Progress Notes (Signed)
  Marikay AlarEric Clint Biello, MD Phone: [phone]  Shawn MeyerCarl English is a 41 y.o. male who presents today for f/u.  HYPERTENSION  Disease Monitoring  Home BP Monitoring 130/86 Chest pain- no    Dyspnea- no Medications  Compliance-  Taking hctz, losartan.  Edema- no  Obesity: Patient has had a job change and is traveling much more.  He eats out a lot due to his work schedule.  He eats lots of airport food.  Not exercising very much though does walk some with his job.  Patient was evaluated by Dr. Katrinka BlazingSmith for cervical radiculopathy.  Found to have herniated disks.  This has improved significantly with 2 courses of prednisone and exercises.  Occasionally has to take a gabapentin for it at night.  Social History   Tobacco Use  Smoking Status Former Smoker  . Types: Cigarettes  . Last attempt to quit: 09/09/2014  . Years since quitting: 3.1  Smokeless Tobacco Never Used     ROS see history of present illness  Objective  Physical Exam Vitals:   10/21/17 0851  BP: 130/90  Pulse: 94  Temp: 98.8 F (37.1 C)  SpO2: 98%    BP Readings from Last 3 Encounters:  10/21/17 130/90  10/11/17 (!) 134/100  09/13/17 124/90   Wt Readings from Last 3 Encounters:  10/21/17 206 lb 3.2 oz (93.5 kg)  10/11/17 206 lb (93.4 kg)  09/13/17 204 lb (92.5 kg)    Physical Exam  Constitutional: No distress.  Cardiovascular: Normal rate, regular rhythm and normal heart sounds.  Pulmonary/Chest: Effort normal and breath sounds normal.  Musculoskeletal: He exhibits no edema.  No midline neck tenderness, no midline neck step-off, normal right mid trapezius tenderness, full neck range of motion, negative Spurling's bilaterally  Neurological: He is alert. Gait normal.  Skin: He is not diaphoretic.     Assessment/Plan: Please see individual problem list.  Essential hypertension Borderline diastolic control.  I discussed adding another medication though the patient is hesitant to do this.  He will continue to  monitor.  Discussed working on diet and exercise.  Cervical radiculopathy at C8 Much improved.  He will continue his exercises and continue to monitor.  Follow-up with me or Dr. Katrinka BlazingSmith if he has recurrent issues.  Obesity Patient needs to work on diet and exercise.   Baldo AshCarl was seen today for follow-up.  Diagnoses and all orders for this visit:  Essential hypertension -     Basic Metabolic Panel (BMET)  Cervical radiculopathy at C8  Class 1 obesity due to excess calories without serious comorbidity with body mass index (BMI) of 32.0 to 32.9 in adult    Orders Placed This Encounter  Procedures  . Basic Metabolic Panel (BMET)    No orders of the defined types were placed in this encounter.    Marikay AlarEric Lakeasha Petion, MD [facility] [place] [facility]

## 2017-11-19 ENCOUNTER — Other Ambulatory Visit: Payer: Self-pay | Admitting: Family Medicine

## 2018-01-05 ENCOUNTER — Other Ambulatory Visit: Payer: Self-pay | Admitting: Family Medicine

## 2018-01-19 ENCOUNTER — Encounter: Payer: Self-pay | Admitting: Family Medicine

## 2018-01-19 ENCOUNTER — Ambulatory Visit: Payer: BLUE CROSS/BLUE SHIELD | Admitting: Family Medicine

## 2018-01-19 ENCOUNTER — Other Ambulatory Visit: Payer: Self-pay

## 2018-01-19 DIAGNOSIS — J309 Allergic rhinitis, unspecified: Secondary | ICD-10-CM

## 2018-01-19 DIAGNOSIS — E6609 Other obesity due to excess calories: Secondary | ICD-10-CM | POA: Diagnosis not present

## 2018-01-19 DIAGNOSIS — Z6832 Body mass index (BMI) 32.0-32.9, adult: Secondary | ICD-10-CM

## 2018-01-19 DIAGNOSIS — I10 Essential (primary) hypertension: Secondary | ICD-10-CM

## 2018-01-19 NOTE — Assessment & Plan Note (Signed)
Well-controlled.  Continue current regimen.  He just had lab work and he will fax that to us.

## 2018-01-19 NOTE — Progress Notes (Signed)
  Marikay AlarEric Dajuana Palen, MD Phone: [phone]  Shawn MeyerCarl English is a 42 y.o. male who presents today for f/u.  HYPERTENSION  Disease Monitoring  Home BP Monitoring similar to today Chest pain- no    Dyspnea- no Medications  Compliance-  Taking HCTZ, losartan.  Edema- no  Allergic rhinitis: 2-3 days. Weather changes cause worsening of symptoms. Drainage.  Some postnasal drip.  Green mucus out of her nose.  Maxillary sinus pressure.  No fevers.  Has been taking decongestant over-the-counter.  Gets this typically this time a year.  He has been working on dietary changes with eating salads while he is traveling.  He was going to fast food though is decreased that.  Vinaigrette dressings.  He has been getting his steps.  Trying to walk more.  Social History   Tobacco Use  Smoking Status Former Smoker  . Types: Cigarettes  . Last attempt to quit: 09/09/2014  . Years since quitting: 3.3  Smokeless Tobacco Never Used     ROS see history of present illness  Objective  Physical Exam Vitals:   01/19/18 0818  BP: 122/80  Pulse: 92  Temp: 98.6 F (37 C)  SpO2: 98%    BP Readings from Last 3 Encounters:  01/19/18 122/80  10/21/17 130/90  10/11/17 (!) 134/100   Wt Readings from Last 3 Encounters:  01/19/18 204 lb 12.8 oz (92.9 kg)  10/21/17 206 lb 3.2 oz (93.5 kg)  10/11/17 206 lb (93.4 kg)    Physical Exam  Constitutional: No distress.  Cardiovascular: Normal rate, regular rhythm and normal heart sounds.  Pulmonary/Chest: Effort normal and breath sounds normal.  Musculoskeletal: He exhibits no edema.  Neurological: He is alert. Gait normal.  Skin: Skin is warm and dry. He is not diaphoretic.     Assessment/Plan: Please see individual problem list.  Essential hypertension Well-controlled.  Continue current regimen.  He just had lab work and he will fax that to us.  Obesity Patient has made great effort and dietary changes and also trying to stay active.  I encouraged him to  continue this with his busy work schedule.  Allergic rhinitis Discussed Claritin or Flonase.  He will monitor.  If not improving he will let us know.   No orders of the defined types were placed in this encounter.   No orders of the defined types were placed in this encounter.    Marikay AlarEric Tiburcio Linder, MD [facility]

## 2018-01-19 NOTE — Patient Instructions (Signed)
Nice to see you. Please fax your lab work to us once it comes back from your insurance. Please continue with diet and exercise. You can try Flonase or Claritin for your allergy issues.

## 2018-01-19 NOTE — Assessment & Plan Note (Signed)
Patient has made great effort and dietary changes and also trying to stay active.  I encouraged him to continue this with his busy work schedule.

## 2018-01-19 NOTE — Assessment & Plan Note (Signed)
Discussed Claritin or Flonase.  He will monitor.  If not improving he will let us know.

## 2018-03-13 ENCOUNTER — Other Ambulatory Visit: Payer: Self-pay | Admitting: Family Medicine

## 2018-04-01 ENCOUNTER — Telehealth: Payer: BLUE CROSS/BLUE SHIELD | Admitting: Family Medicine

## 2018-04-01 DIAGNOSIS — B9789 Other viral agents as the cause of diseases classified elsewhere: Secondary | ICD-10-CM

## 2018-04-01 DIAGNOSIS — J019 Acute sinusitis, unspecified: Secondary | ICD-10-CM

## 2018-04-01 MED ORDER — IPRATROPIUM BROMIDE 0.03 % NA SOLN: 2.0000 | Freq: Three times a day (TID) | NASAL | 0 refills | Status: DC

## 2018-04-01 NOTE — Progress Notes (Signed)
We are sorry that you are not feeling well.  Here is how we plan to help!  Based on what you have shared with me it looks like you have sinusitis.  Sinusitis is inflammation and infection in the sinus cavities of the head.  Based on your presentation I believe you most likely have Acute Viral Sinusitis.This is an infection most likely caused by a virus. There is not specific treatment for viral sinusitis other than to help you with the symptoms until the infection runs its course.  You may use an oral decongestant such as Mucinex D or if you have glaucoma or high blood pressure use plain Mucinex. Saline nasal spray help and can safely be used as often as needed for congestion, I have prescribed: Ipratropium Bromide nasal spray 0.03% 2 sprays in eah nostril 2-3 times a day  Some authorities believe that zinc sprays or the use of Echinacea may shorten the course of your symptoms.  Sinus infections are not as easily transmitted as other respiratory infection, however we still recommend that you avoid close contact with loved ones, especially the very young and elderly.  Remember to wash your hands thoroughly throughout the day as this is the number one way to prevent the spread of infection!  Please follow up if your symptoms progress or worsen.  Home Care:  Only take medications as instructed by your medical team.  Do not take these medications with alcohol.  A steam or ultrasonic humidifier can help congestion.  You can place a towel over your head and breathe in the steam from hot water coming from a faucet.  Avoid close contacts especially the very young and the elderly.  Cover your mouth when you cough or sneeze.  Always remember to wash your hands.  Get Help Right Away If:  You develop worsening fever or sinus pain.  You develop a severe head ache or visual changes.  Your symptoms persist after you have completed your treatment plan.  Make sure you  Understand these  instructions.  Will watch your condition.  Will get help right away if you are not doing well or get worse.  Your e-visit answers were reviewed by a board certified advanced clinical practitioner to complete your personal care plan.  Depending on the condition, your plan could have included both over the counter or prescription medications.  If there is a problem please reply  once you have received a response from your provider.  Your safety is important to [place].  If you have drug allergies check your prescription carefully.    You can use MyChart to ask questions about today's visit, request a non-urgent call back, or ask for a work or school excuse for 24 hours related to this e-Visit. If it has been greater than 24 hours you will need to follow up with your provider, or enter a new e-Visit to address those concerns.  You will get an e-mail in the next two days asking about your experience.  I hope that your e-visit has been valuable and will speed your recovery. Thank you for using e-visits.

## 2018-04-04 ENCOUNTER — Other Ambulatory Visit: Payer: Self-pay | Admitting: Family Medicine

## 2018-07-04 ENCOUNTER — Other Ambulatory Visit: Payer: Self-pay | Admitting: Family Medicine

## 2018-08-08 ENCOUNTER — Encounter: Payer: Self-pay | Admitting: Family Medicine

## 2018-08-08 ENCOUNTER — Ambulatory Visit: Payer: BLUE CROSS/BLUE SHIELD | Admitting: Family Medicine

## 2018-08-08 VITALS — BP 140/100 | HR 77 | Temp 98.2°F | Ht 66.0 in | Wt 207.6 lb

## 2018-08-08 DIAGNOSIS — Z23 Encounter for immunization: Secondary | ICD-10-CM

## 2018-08-08 DIAGNOSIS — J309 Allergic rhinitis, unspecified: Secondary | ICD-10-CM | POA: Diagnosis not present

## 2018-08-08 DIAGNOSIS — I10 Essential (primary) hypertension: Secondary | ICD-10-CM | POA: Diagnosis not present

## 2018-08-08 LAB — BASIC METABOLIC PANEL
BUN: 12 mg/dL (ref 6–23)
CALCIUM: 9.6 mg/dL (ref 8.4–10.5)
CO2: 29 mEq/L (ref 19–32)
Chloride: 105 mEq/L (ref 96–112)
Creatinine, Ser: 0.98 mg/dL (ref 0.40–1.50)
GFR: 89.9 mL/min (ref >60.00)
Glucose, Bld: 89 mg/dL (ref 70–99)
Potassium: 4 mEq/L (ref 3.5–5.1)
SODIUM: 141 meq/L (ref 135–145)

## 2018-08-08 NOTE — Assessment & Plan Note (Signed)
Well-controlled at home.  He has been up all night on a right eye flight and I suspect that is contributing to his elevated blood pressure.  He will continue to monitor at home and if it elevates at home let us know.  Continue current medication.  Check BMP.

## 2018-08-08 NOTE — Progress Notes (Signed)
  Marikay Alar, MD Phone: [phone]  Shawn English is a 42 y.o. male who presents today for f/u.  CC: htn, allergies  HYPERTENSION  Disease Monitoring  Home BP Monitoring 134/74 Chest pain- no    Dyspnea- no Medications  Compliance-  Taking HCTZ, losartan  Edema- no  Allergic rhinitis: Patient notes no symptoms.  Typically starts in November.  Notes sneezing, rhinorrhea, or postnasal drip.  No medications for this currently.  He typically takes NyQuil in the fall.  Social History   Tobacco Use  Smoking Status Former Smoker  . Types: Cigarettes  . Last attempt to quit: 09/09/2014  . Years since quitting: 3.9  Smokeless Tobacco Never Used     ROS see history of present illness  Objective  Physical Exam Vitals:   08/08/18 0805  BP: (!) 150/98  Pulse: 77  Temp: 98.2 F (36.8 C)  SpO2: 97%    BP Readings from Last 3 Encounters:  08/08/18 (!) 150/98  01/19/18 122/80  10/21/17 130/90   Wt Readings from Last 3 Encounters:  08/08/18 207 lb 9.6 oz (94.2 kg)  01/19/18 204 lb 12.8 oz (92.9 kg)  10/21/17 206 lb 3.2 oz (93.5 kg)    Physical Exam  Constitutional: No distress.  Cardiovascular: Normal rate, regular rhythm and normal heart sounds.  Pulmonary/Chest: Effort normal and breath sounds normal.  Musculoskeletal: He exhibits no edema.  Neurological: He is alert.  Skin: Skin is warm and dry. He is not diaphoretic.     Assessment/Plan: Please see individual problem list.  Essential hypertension Well-controlled at home.  He has been up all night on a right eye flight and I suspect that is contributing to his elevated blood pressure.  He will continue to monitor at home and if it elevates at home let us know.  Continue current medication.  Check BMP.  Allergic rhinitis Asymptomatic.  He will monitor for symptoms.    Orders Placed This Encounter  Procedures  . Flu Vaccine QUAD 6+ mos PF IM (Fluarix Quad PF)  . Basic Metabolic Panel (BMET)    No orders  of the defined types were placed in this encounter.    Marikay Alar, MD [facility]

## 2018-08-08 NOTE — Assessment & Plan Note (Signed)
Asymptomatic.  He will monitor for symptoms.

## 2018-08-08 NOTE — Patient Instructions (Signed)
Nice to see you. Please continue to monitor your blood pressure at home.  If it begins to elevated at home please let us know. We will get lab work today and contact you with the results.

## 2018-09-05 ENCOUNTER — Other Ambulatory Visit: Payer: Self-pay | Admitting: Family Medicine

## 2018-09-30 ENCOUNTER — Other Ambulatory Visit: Payer: Self-pay | Admitting: Family Medicine

## 2018-12-09 ENCOUNTER — Other Ambulatory Visit: Payer: Self-pay | Admitting: Family Medicine

## 2018-12-31 ENCOUNTER — Other Ambulatory Visit: Payer: Self-pay | Admitting: Family Medicine

## 2019-02-06 ENCOUNTER — Ambulatory Visit: Payer: BLUE CROSS/BLUE SHIELD | Admitting: Family Medicine

## 2019-03-13 ENCOUNTER — Other Ambulatory Visit: Payer: Self-pay | Admitting: Family Medicine

## 2019-03-27 ENCOUNTER — Ambulatory Visit: Payer: BLUE CROSS/BLUE SHIELD | Admitting: Family Medicine

## 2019-03-28 ENCOUNTER — Other Ambulatory Visit: Payer: Self-pay | Admitting: Family Medicine

## 2019-03-29 ENCOUNTER — Encounter: Payer: Self-pay | Admitting: Family Medicine

## 2019-03-29 ENCOUNTER — Ambulatory Visit (INDEPENDENT_AMBULATORY_CARE_PROVIDER_SITE_OTHER): Payer: BLUE CROSS/BLUE SHIELD | Admitting: Family Medicine

## 2019-03-29 ENCOUNTER — Other Ambulatory Visit: Payer: Self-pay

## 2019-03-29 DIAGNOSIS — E6609 Other obesity due to excess calories: Secondary | ICD-10-CM

## 2019-03-29 DIAGNOSIS — I10 Essential (primary) hypertension: Secondary | ICD-10-CM | POA: Diagnosis not present

## 2019-03-29 DIAGNOSIS — Z6832 Body mass index (BMI) 32.0-32.9, adult: Secondary | ICD-10-CM

## 2019-03-29 NOTE — Assessment & Plan Note (Signed)
Blood pressure has been well controlled.  He will continue his current regimen.  He will monitor for decreasing blood pressure or lightheadedness and let us know if these occur.  He notes he is due to have lab work through work with a full panel to be completed.  He will have this done within the next month and get [place] a copy of the results.

## 2019-03-29 NOTE — Progress Notes (Signed)
Virtual Visit via video Note  This visit type was conducted due to national recommendations for restrictions regarding the COVID-19 pandemic (e.g. social distancing).  This format is felt to be most appropriate for this patient at this time.  All issues noted in this document were discussed and addressed.  No physical exam was performed (except for noted visual exam findings with Video Visits).   I connected with Shawn English today at  3:45 PM EDT by a video enabled telemedicine application and verified that I am speaking with the correct person using two identifiers. Location patient: home Location provider: work Persons participating in the virtual visit: patient, provider  I discussed the limitations, risks, security and privacy concerns of performing an evaluation and management service by telephone and the availability of in person appointments. I also discussed with the patient that there may be a patient responsible charge related to this service. The patient expressed understanding and agreed to proceed.  Reason for visit: follow-up  HPI: HYPERTENSION  Disease Monitoring  Home BP Monitoring 119/71 is typical Chest pain- no    Dyspnea- no Medications  Compliance-  Taking losartan, HCTZ.  Edema- no  Obesity: Patient has been working on exercise and dietary changes.  He has been grilling his meats and eating lots of vegetables.  He has been walking 3 miles most days.  He notes he is down to 196 pounds.  He notes he feels great and has enjoyed working from home because he can be more active.     ROS: See pertinent positives and negatives per HPI.  Past Medical History:  Diagnosis Date  . Chicken pox   . Hypertension     Past Surgical History:  Procedure Laterality Date  . CYST REMOVAL HAND  2009   Benign cyst removed from right arm    Family History  Problem Relation Age of Onset  . Hypertension Father   . Cancer Maternal Grandmother        breast    SOCIAL HX:  Former smoker   Current Outpatient Medications:  .  EPINEPHrine 0.3 mg/0.3 mL IJ SOAJ injection, Inject 0.3 mLs (0.3 mg total) into the muscle as needed., Disp: 2 Device, Rfl: 0 .  gabapentin (NEURONTIN) 100 MG capsule, Take 2 capsules (200 mg total) by mouth at bedtime., Disp: 60 capsule, Rfl: 3 .  hydrochlorothiazide (HYDRODIURIL) 25 MG tablet, TAKE 1 TABLET BY MOUTH DAILY, Disp: 90 tablet, Rfl: 0 .  ipratropium (ATROVENT) 0.03 % nasal spray, Place 2 sprays into both nostrils 3 (three) times daily., Disp: 30 mL, Rfl: 0 .  losartan (COZAAR) 100 MG tablet, TAKE 1 TABLET(100 MG) BY MOUTH DAILY, Disp: 90 tablet, Rfl: 0 .  tiZANidine (ZANAFLEX) 4 MG tablet, Take 1 tablet (4 mg total) by mouth every 6 (six) hours as needed for muscle spasms., Disp: 30 tablet, Rfl: 0 .  traMADol (ULTRAM) 50 MG tablet, Take 1 tablet (50 mg total) by mouth every 12 (twelve) hours as needed., Disp: 30 tablet, Rfl: 0  EXAM:  VITALS per patient if applicable: None.  GENERAL: alert, oriented, appears well and in no acute distress  HEENT: atraumatic, conjunttiva clear, no obvious abnormalities on inspection of external nose and ears  NECK: normal movements of the head and neck  LUNGS: on inspection no signs of respiratory distress, breathing rate appears normal, no obvious gross SOB, gasping or wheezing  CV: no obvious cyanosis  MS: moves all visible extremities without noticeable abnormality  PSYCH/NEURO: pleasant and cooperative,  no obvious depression or anxiety, speech and thought processing grossly intact  ASSESSMENT AND PLAN:  Discussed the following assessment and plan:  Essential hypertension  Class 1 obesity due to excess calories without serious comorbidity with body mass index (BMI) of 32.0 to 32.9 in adult  Essential hypertension Blood pressure has been well controlled.  He will continue his current regimen.  He will monitor for decreasing blood pressure or lightheadedness and let us know if  these occur.  He notes he is due to have lab work through work with a full panel to be completed.  He will have this done within the next month and get [place] a copy of the results.  Obesity Congratulated on weight loss.  Encouraged continued diet and exercise.  CMA will contact the patient to schedule him for follow-up in 6 months.  Social distancing precautions and sick precautions given regarding COVID-19.   I discussed the assessment and treatment plan with the patient. The patient was provided an opportunity to ask questions and all were answered. The patient agreed with the plan and demonstrated an understanding of the instructions.   The patient was advised to call back or seek an in-person evaluation if the symptoms worsen or if the condition fails to improve as anticipated.   Marikay Alar, MD

## 2019-03-29 NOTE — Progress Notes (Signed)
Last BP was 119/71 checked today

## 2019-03-29 NOTE — Assessment & Plan Note (Signed)
Congratulated on weight loss.  Encouraged continued diet and exercise.

## 2019-04-07 ENCOUNTER — Other Ambulatory Visit: Payer: Self-pay | Admitting: Family Medicine

## 2019-04-10 NOTE — Telephone Encounter (Signed)
Copied from CRM [id number]. Topic: Quick Communication - Rx Refill/Question >> Apr 10, 2019 12:12 PM Crist Infante wrote: Medication: losartan (COZAAR) 100 MG tablet Pt follow with Dr Antony Haste last week and thought this med was to be called in, but it is not at the pharmacy.  Pt requesting refill sent to  [id number] [place], [place] - [address] AT [facility] [address] [phone] (Phone) [phone] (Fax)

## 2019-04-11 MED ORDER — LOSARTAN POTASSIUM 100 MG PO TABS: 100.0000 mg | ORAL_TABLET | Freq: Every day | ORAL | 0 refills | Status: DC

## 2019-06-08 ENCOUNTER — Other Ambulatory Visit: Payer: Self-pay | Admitting: Family Medicine

## 2019-06-16 ENCOUNTER — Other Ambulatory Visit: Payer: Self-pay

## 2019-06-16 DIAGNOSIS — Z20822 Contact with and (suspected) exposure to covid-19: Secondary | ICD-10-CM

## 2019-06-17 LAB — NOVEL CORONAVIRUS, NAA: SARS-CoV-2, NAA: NOT DETECTED

## 2019-07-05 ENCOUNTER — Other Ambulatory Visit: Payer: Self-pay | Admitting: Family Medicine

## 2019-09-02 ENCOUNTER — Other Ambulatory Visit: Payer: Self-pay | Admitting: Family Medicine

## 2019-10-16 ENCOUNTER — Other Ambulatory Visit: Payer: Self-pay | Admitting: Family Medicine

## 2019-10-20 LAB — BASIC METABOLIC PANEL
BUN: 13 (ref 4–21)
Chloride: 100 (ref 99–108)
Creatinine: 1 (ref <=1.3)
Glucose: 91
Potassium: 3.9 (ref 3.4–5.3)
Sodium: 142 (ref 137–147)

## 2019-10-20 LAB — TSH: TSH: 2.23 (ref <=5.90)

## 2019-10-20 LAB — HEPATIC FUNCTION PANEL
ALT: 47 — AB (ref 10–40)
AST: 26 (ref 14–40)
Alkaline Phosphatase: 65 (ref 25–125)
Bilirubin, Total: 0.5

## 2019-10-20 LAB — CBC AND DIFFERENTIAL
HCT: 43 (ref 41–53)
Hemoglobin: 14.8 (ref 13.5–17.5)
Platelets: 236 (ref 150–399)
WBC: 6.8

## 2019-10-20 LAB — LIPID PANEL
Cholesterol: 193 (ref 0–200)
HDL: 39 (ref 35–70)
LDL Cholesterol: 128
LDl/HDL Ratio: 3.3
Triglycerides: 144 (ref 40–160)

## 2019-10-20 LAB — COMPREHENSIVE METABOLIC PANEL
Albumin: 4.8 (ref 3.5–5.0)
Calcium: 10.1 (ref 8.7–10.7)

## 2019-10-20 LAB — CBC: RBC: 4.93 (ref 3.87–5.11)

## 2019-10-23 ENCOUNTER — Other Ambulatory Visit: Payer: Self-pay

## 2019-10-23 MED ORDER — LOSARTAN POTASSIUM 100 MG PO TABS: ORAL_TABLET | ORAL | 0 refills | Status: DC

## 2019-11-12 ENCOUNTER — Telehealth: Payer: Self-pay | Admitting: Family Medicine

## 2019-11-12 NOTE — Telephone Encounter (Signed)
Labs received on patient. He is due for a follow-up visit. Please get him scheduled some time in the next 1-2 months for follow-up and to discuss his lab results. Thanks.

## 2019-11-13 NOTE — Telephone Encounter (Signed)
I called and scheduled a office visit with the patient to go over lab results.  Shawn English,cma

## 2019-11-17 ENCOUNTER — Other Ambulatory Visit: Payer: Self-pay | Admitting: Family Medicine

## 2019-11-17 ENCOUNTER — Other Ambulatory Visit: Payer: Self-pay

## 2019-12-12 ENCOUNTER — Other Ambulatory Visit: Payer: Self-pay | Admitting: Family Medicine

## 2020-01-02 ENCOUNTER — Other Ambulatory Visit: Payer: Self-pay

## 2020-01-02 ENCOUNTER — Ambulatory Visit (INDEPENDENT_AMBULATORY_CARE_PROVIDER_SITE_OTHER): Payer: BC Managed Care – PPO | Admitting: Family Medicine

## 2020-01-02 DIAGNOSIS — Z87892 Personal history of anaphylaxis: Secondary | ICD-10-CM

## 2020-01-02 DIAGNOSIS — R7989 Other specified abnormal findings of blood chemistry: Secondary | ICD-10-CM | POA: Diagnosis not present

## 2020-01-02 DIAGNOSIS — E7800 Pure hypercholesterolemia, unspecified: Secondary | ICD-10-CM

## 2020-01-02 DIAGNOSIS — I10 Essential (primary) hypertension: Secondary | ICD-10-CM | POA: Diagnosis not present

## 2020-01-02 DIAGNOSIS — K760 Fatty (change of) liver, not elsewhere classified: Secondary | ICD-10-CM | POA: Insufficient documentation

## 2020-01-02 MED ORDER — EPINEPHRINE 0.3 MG/0.3ML IJ SOAJ: 0.3000 mg | INTRAMUSCULAR | 0 refills | Status: AC | PRN

## 2020-01-02 NOTE — Assessment & Plan Note (Signed)
Recheck in the next couple of weeks.

## 2020-01-02 NOTE — Assessment & Plan Note (Signed)
EpiPen refilled 

## 2020-01-02 NOTE — Assessment & Plan Note (Signed)
Well-controlled.  Continue current regimen. 

## 2020-01-02 NOTE — Progress Notes (Signed)
Virtual Visit via video Note  This visit type was conducted due to national recommendations for restrictions regarding the COVID-19 pandemic (e.g. social distancing).  This format is felt to be most appropriate for this patient at this time.  All issues noted in this document were discussed and addressed.  No physical exam was performed (except for noted visual exam findings with Video Visits).   I connected with Shawn English today at 12:00 PM EST by a video enabled telemedicine application and verified that I am speaking with the correct person using two identifiers. Location patient: home Location provider: work  Persons participating in the virtual visit: patient, provider  I discussed the limitations, risks, security and privacy concerns of performing an evaluation and management service by telephone and the availability of in person appointments. I also discussed with the patient that there may be a patient responsible charge related to this service. The patient expressed understanding and agreed to proceed.  Reason for visit: f/u  HPI: Hypertension: Typically 120-125/74-80.  Taking losartan HCTZ.  No chest pain, shortness of breath, or edema.  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  He believes this is out of date.  Elevated LDL: ASCVD risk score is 1.9%.  He has made diet changes since getting his labs back.  He is eating oatmeal with turmeric.  He said some weight loss.  He is going to walk for exercises.  Elevated LFTs: Elevated on labs.  He notes he had had a number of alcoholic beverages over the holidays prior to having his labs completed.   ROS: See pertinent positives and negatives per HPI.  Past Medical History:  Diagnosis Date  . Chicken pox   . Hypertension     Past Surgical History:  Procedure Laterality Date  . CYST REMOVAL HAND  2009   Benign cyst removed from right arm    Family History  Problem Relation Age of Onset  .  Hypertension Father   . Cancer Maternal Grandmother        breast    SOCIAL HX: Former smoker   Current Outpatient Medications:  .  EPINEPHrine 0.3 mg/0.3 mL IJ SOAJ injection, Inject 0.3 mLs (0.3 mg total) into the muscle as needed., Disp: 2 each, Rfl: 0 .  hydrochlorothiazide (HYDRODIURIL) 25 MG tablet, TAKE 1 TABLET BY MOUTH DAILY, Disp: 90 tablet, Rfl: 0 .  losartan (COZAAR) 100 MG tablet, TAKE 1 TABLET(100 MG) BY MOUTH DAILY, Disp: 30 tablet, Rfl: 1  EXAM:  VITALS per patient if applicable:  GENERAL: alert, oriented, appears well and in no acute distress  HEENT: atraumatic, conjunttiva clear, no obvious abnormalities on inspection of external nose and ears  NECK: normal movements of the head and neck  LUNGS: on inspection no signs of respiratory distress, breathing rate appears normal, no obvious gross SOB, gasping or wheezing  CV: no obvious cyanosis  MS: moves all visible extremities without noticeable abnormality  PSYCH/NEURO: pleasant and cooperative, no obvious depression or anxiety, speech and thought processing grossly intact  ASSESSMENT AND PLAN:  Discussed the following assessment and plan:  History of anaphylaxis EpiPen refilled  Essential hypertension Well-controlled.  Continue current regimen.  Elevated LFTs Recheck in the next couple of weeks.  Elevated LDL cholesterol level Low risk by ASCVD risk score. Continue with diet and exercise.    Orders Placed This Encounter  Procedures  . Hepatic function panel    Standing Status:   Future    Standing Expiration Date:  01/01/2021    Meds ordered this encounter  Medications  . EPINEPHrine 0.3 mg/0.3 mL IJ SOAJ injection    Sig: Inject 0.3 mLs (0.3 mg total) into the muscle as needed.    Dispense:  2 each    Refill:  0     I discussed the assessment and treatment plan with the patient. The patient was provided an opportunity to ask questions and all were answered. The patient agreed with the  plan and demonstrated an understanding of the instructions.   The patient was advised to call back or seek an in-person evaluation if the symptoms worsen or if the condition fails to improve as anticipated.    Tommi Rumps, MD

## 2020-01-02 NOTE — Assessment & Plan Note (Signed)
Low risk by ASCVD risk score. Continue with diet and exercise.

## 2020-01-14 ENCOUNTER — Other Ambulatory Visit: Payer: Self-pay | Admitting: Family Medicine

## 2020-01-16 ENCOUNTER — Other Ambulatory Visit: Payer: Self-pay

## 2020-01-16 ENCOUNTER — Other Ambulatory Visit (INDEPENDENT_AMBULATORY_CARE_PROVIDER_SITE_OTHER): Payer: BC Managed Care – PPO

## 2020-01-16 DIAGNOSIS — R7989 Other specified abnormal findings of blood chemistry: Secondary | ICD-10-CM | POA: Diagnosis not present

## 2020-01-17 LAB — HEPATIC FUNCTION PANEL
ALT: 64 U/L — ABNORMAL HIGH (ref 0–53)
AST: 33 U/L (ref 0–37)
Albumin: 5 g/dL (ref 3.5–5.2)
Alkaline Phosphatase: 57 U/L (ref 39–117)
Bilirubin, Direct: 0.2 mg/dL (ref 0.0–0.3)
Total Bilirubin: 1 mg/dL (ref 0.2–1.2)
Total Protein: 7.8 g/dL (ref 6.0–8.3)

## 2020-01-21 ENCOUNTER — Other Ambulatory Visit: Payer: Self-pay | Admitting: Family Medicine

## 2020-01-21 DIAGNOSIS — R7989 Other specified abnormal findings of blood chemistry: Secondary | ICD-10-CM

## 2020-01-21 DIAGNOSIS — R945 Abnormal results of liver function studies: Secondary | ICD-10-CM

## 2020-02-17 ENCOUNTER — Other Ambulatory Visit: Payer: Self-pay | Admitting: Family Medicine

## 2020-03-09 ENCOUNTER — Other Ambulatory Visit: Payer: Self-pay | Admitting: Family Medicine

## 2020-03-14 ENCOUNTER — Other Ambulatory Visit: Payer: Self-pay

## 2020-03-14 ENCOUNTER — Ambulatory Visit (INDEPENDENT_AMBULATORY_CARE_PROVIDER_SITE_OTHER): Payer: BC Managed Care – PPO | Admitting: Gastroenterology

## 2020-03-14 VITALS — BP 162/114 | HR 98 | Temp 98.4°F | Ht 66.0 in | Wt 209.2 lb

## 2020-03-14 DIAGNOSIS — K760 Fatty (change of) liver, not elsewhere classified: Secondary | ICD-10-CM | POA: Diagnosis not present

## 2020-03-14 DIAGNOSIS — R7989 Other specified abnormal findings of blood chemistry: Secondary | ICD-10-CM | POA: Diagnosis not present

## 2020-03-14 NOTE — Progress Notes (Signed)
Jonathon Bellows MD, MRCP(U.K) [address]  [place]  [place], [place] [address]  Main: [phone]  Fax: [phone]   Gastroenterology Consultation  Referring Provider:     Leone Haven, MD Primary Care Physician:  Leone Haven, MD Primary Gastroenterologist:  Dr. Jonathon Bellows  Reason for Consultation:     Abnormal LFTs        HPI:   Shawn English is a 44 y.o. y/o male referred for consultation & management  by Dr. Caryl Bis, Angela Adam, MD.    First noted abnormality in LFT's in 2018  Alcohol use past 1 year 2 drinks every night which he just stopped.  Prior to that a few times a week 1-2 drinks  Drug use no Over the counter herbal supplements no New medications no Abdominal pain no Tattoos no Military service no Prior blood transfusion no Incarceration no History of travel not relevant Family history of liver disease no Recent change in weight: Some weight gain   Hepatic Function Latest Ref Rng & Units 01/16/2020 10/20/2019 11/21/2016  Total Protein 6.0 - 8.3 g/dL 7.8 - -  Albumin 3.5 - 5.2 g/dL 5.0 4.8 -  AST 0 - 37 U/L 33 26 28  ALT 0 - 53 U/L 64(H) 47(A) 51(A)  Alk Phosphatase 39 - 117 U/L 57 65 74  Total Bilirubin 0.2 - 1.2 mg/dL 1.0 - -  Bilirubin, Direct 0.0 - 0.3 mg/dL 0.2 - -       Past Medical History:  Diagnosis Date  . Chicken pox   . Hypertension     Past Surgical History:  Procedure Laterality Date  . CYST REMOVAL HAND  2009   Benign cyst removed from right arm    Prior to Admission medications   Medication Sig Start Date End Date Taking? Authorizing Provider  EPINEPHrine 0.3 mg/0.3 mL IJ SOAJ injection Inject 0.3 mLs (0.3 mg total) into the muscle as needed. 01/02/20   Leone Haven, MD  hydrochlorothiazide (HYDRODIURIL) 25 MG tablet TAKE 1 TABLET BY MOUTH DAILY 03/11/20   Leone Haven, MD  losartan (COZAAR) 100 MG tablet TAKE 1 TABLET(100 MG) BY MOUTH DAILY 02/17/20   Leone Haven, MD    Family History  Problem  Relation Age of Onset  . Hypertension Father   . Cancer Maternal Grandmother        breast     Social History   Tobacco Use  . Smoking status: Former Smoker    Types: Cigarettes    Quit date: 09/09/2014    Years since quitting: 5.5  . Smokeless tobacco: Never Used  Substance Use Topics  . Alcohol use: Yes    Alcohol/week: 1.0 standard drinks    Types: 1 Standard drinks or equivalent per week    Comment: Occasional use   . Drug use: No    Allergies as of 03/14/2020 - Review Complete 03/29/2019  Allergen Reaction Noted  . Bee venom Swelling 03/21/2015  . Fish-derived products Anaphylaxis 03/21/2015  . Penicillins Anaphylaxis 03/21/2015  . Sulfa antibiotics Nausea And Vomiting 03/21/2015    Review of Systems:    All systems reviewed and negative except where noted in HPI.   Physical Exam:  There were no vitals taken for this visit. No LMP for male patient. Psych:  Alert and cooperative. Normal mood and affect. General:   Alert,  Well-developed, well-nourished, pleasant and cooperative in NAD Head:  Normocephalic and atraumatic. Eyes:  Sclera clear, no icterus.   Conjunctiva  pink. Ears:  Normal auditory acuity. Lungs:  Respirations even and unlabored.  Clear throughout to auscultation.   No wheezes, crackles, or rhonchi. No acute distress. Heart:  Regular rate and rhythm; no murmurs, clicks, rubs, or gallops. Abdomen:  Normal bowel sounds.  No bruits.  Soft, non-tender and non-distended without masses, hepatosplenomegaly or hernias noted.  No guarding or rebound tenderness.    Neurologic:  Alert and oriented x3;  grossly normal neurologically. Psych:  Alert and cooperative. Normal mood and affect.  Imaging Studies: No results found.  Assessment and Plan:   Shawn English is a 44 y.o. y/o male has been referred for normal LFTs.  Further labs necessary to look for viral hepatitis, autoimmune liver disease, Primary Biliary cirrhosis, celiac disease, muscle disorders,Primary  Sclerosing Cholangitis, Hemachromatosis, Wilson's disease, or A-1 antitrypsin deficiency.  Historically to the ALT which has been minimally elevated.  AST and total bilirubin and alkaline phosphatase have always been normal. We will also obtain right upper quadrant ultrasound.  Very likely abnormal liver tests a combination of nonalcoholic fatty liver disease and probably a small effect from alcohol.  He has already stopped consuming alcohol in larger quantities.  I have discussed it with him in depth about lifestyle changes for NAFLD.  Have provided him information about a Mediterranean diet.  He is aware that he should try and lose weight and exercise which he is trying to.   Follow up in 6 to 8 weeks virtual visit  Dr Jonathon Bellows MD,MRCP(U.K)

## 2020-03-16 ENCOUNTER — Other Ambulatory Visit: Payer: Self-pay | Admitting: Family Medicine

## 2020-03-20 ENCOUNTER — Telehealth: Payer: Self-pay

## 2020-03-20 LAB — IMMUNOGLOBULINS A/E/G/M, SERUM
IgA/Immunoglobulin A, Serum: 61 mg/dL — ABNORMAL LOW (ref 90–386)
IgE (Immunoglobulin E), Serum: 29 IU/mL (ref 6–495)
IgG (Immunoglobin G), Serum: 780 mg/dL (ref 603–1613)
IgM (Immunoglobulin M), Srm: 59 mg/dL (ref 20–172)

## 2020-03-20 LAB — IRON,TIBC AND FERRITIN PANEL
Ferritin: 348 ng/mL (ref 30–400)
Iron Saturation: 15 % (ref 15–55)
Iron: 53 ug/dL (ref 38–169)
Total Iron Binding Capacity: 354 ug/dL (ref 250–450)
UIBC: 301 ug/dL (ref 111–343)

## 2020-03-20 LAB — HEPATITIS B SURFACE ANTIBODY,QUALITATIVE: Hep B Surface Ab, Qual: REACTIVE

## 2020-03-20 LAB — MITOCHONDRIAL/SMOOTH MUSCLE AB PNL
Mitochondrial Ab: <20 Units (ref 0.0–20.0)
Smooth Muscle Ab: 8 Units (ref 0–19)

## 2020-03-20 LAB — HIV ANTIBODY (ROUTINE TESTING W REFLEX): HIV Screen 4th Generation wRfx: NONREACTIVE

## 2020-03-20 LAB — HEPATITIS B E ANTIGEN: Hep B E Ag: NEGATIVE

## 2020-03-20 LAB — CK: Total CK: 74 U/L (ref 49–439)

## 2020-03-20 LAB — CERULOPLASMIN: Ceruloplasmin: 25.2 mg/dL (ref 16.0–31.0)

## 2020-03-20 LAB — CELIAC DISEASE AB SCREEN W/RFX
Antigliadin Abs, IgA: 5 units (ref 0–19)
Transglutaminase IgA: <2 U/mL (ref 0–3)

## 2020-03-20 LAB — ANA: Anti Nuclear Antibody (ANA): NEGATIVE

## 2020-03-20 LAB — ANTI-MICROSOMAL ANTIBODY LIVER / KIDNEY: LKM1 Ab: 1.1 Units (ref 0.0–20.0)

## 2020-03-20 LAB — HEPATITIS B CORE ANTIBODY, TOTAL: Hep B Core Total Ab: NEGATIVE

## 2020-03-20 LAB — HEPATITIS B E ANTIBODY: Hep B E Ab: NEGATIVE

## 2020-03-20 LAB — HEPATITIS B SURFACE ANTIGEN: Hepatitis B Surface Ag: NEGATIVE

## 2020-03-20 LAB — HEPATITIS C ANTIBODY: Hep C Virus Ab: <0.1 s/co ratio (ref 0.0–0.9)

## 2020-03-20 LAB — ALPHA-1-ANTITRYPSIN: A-1 Antitrypsin: 119 mg/dL (ref 101–187)

## 2020-03-20 LAB — HEPATITIS A ANTIBODY, TOTAL: hep A Total Ab: NEGATIVE

## 2020-03-20 NOTE — Telephone Encounter (Signed)
-----   Message from Wyline Mood, MD sent at 03/19/2020 11:01 AM EDT ----- Inform all labs normal, not immune to hep A- needs vaccine , immune to hep B

## 2020-03-26 ENCOUNTER — Other Ambulatory Visit: Payer: Self-pay

## 2020-03-26 ENCOUNTER — Ambulatory Visit
Admission: RE | Admit: 2020-03-26 | Discharge: 2020-03-26 | Disposition: A | Discharge status: Home / Self Care | Payer: BC Managed Care – PPO | Source: Ambulatory Visit | Attending: Gastroenterology | Admitting: Gastroenterology

## 2020-03-26 DIAGNOSIS — R7989 Other specified abnormal findings of blood chemistry: Secondary | ICD-10-CM | POA: Diagnosis not present

## 2020-03-26 DIAGNOSIS — K7689 Other specified diseases of liver: Secondary | ICD-10-CM | POA: Diagnosis not present

## 2020-04-02 ENCOUNTER — Telehealth: Payer: Self-pay

## 2020-04-02 NOTE — Telephone Encounter (Signed)
-----   Message from Wyline Mood, MD sent at 04/01/2020  2:21 PM EDT ----- He has features that can be seen in fatty liver

## 2020-04-02 NOTE — Telephone Encounter (Signed)
Called and left a message for call back  

## 2020-04-02 NOTE — Telephone Encounter (Signed)
Patient verbalized understanding of results  

## 2020-06-09 ENCOUNTER — Other Ambulatory Visit: Payer: Self-pay | Admitting: Family Medicine

## 2020-07-03 ENCOUNTER — Other Ambulatory Visit: Payer: Self-pay

## 2020-07-03 ENCOUNTER — Telehealth (INDEPENDENT_AMBULATORY_CARE_PROVIDER_SITE_OTHER): Payer: BC Managed Care – PPO | Admitting: Family Medicine

## 2020-07-03 ENCOUNTER — Encounter: Payer: Self-pay | Admitting: Family Medicine

## 2020-07-03 VITALS — BP 124/81 | Ht 66.0 in | Wt 196.0 lb

## 2020-07-03 DIAGNOSIS — I10 Essential (primary) hypertension: Secondary | ICD-10-CM

## 2020-07-03 DIAGNOSIS — E6609 Other obesity due to excess calories: Secondary | ICD-10-CM

## 2020-07-03 DIAGNOSIS — Z6831 Body mass index (BMI) 31.0-31.9, adult: Secondary | ICD-10-CM

## 2020-07-03 DIAGNOSIS — K760 Fatty (change of) liver, not elsewhere classified: Secondary | ICD-10-CM

## 2020-07-03 NOTE — Assessment & Plan Note (Signed)
Continue with diet and exercise. We will check his liver function today.

## 2020-07-03 NOTE — Progress Notes (Signed)
Virtual Visit via video Note  This visit type was conducted due to national recommendations for restrictions regarding the COVID-19 pandemic (e.g. social distancing).  This format is felt to be most appropriate for this patient at this time.  All issues noted in this document were discussed and addressed.  No physical exam was performed (except for noted visual exam findings with Video Visits).   I connected with Shawn English today at  8:00 AM EDT by a video enabled telemedicine application and verified that I am speaking with the correct person using two identifiers. Location patient: home Location provider: work Persons participating in the virtual visit: patient, provider  I discussed the limitations, risks, security and privacy concerns of performing an evaluation and management service by telephone and the availability of in person appointments. I also discussed with the patient that there may be a patient responsible charge related to this service. The patient expressed understanding and agreed to proceed.  Reason for visit: f/u  HPI: HYPERTENSION  Disease Monitoring  Home BP Monitoring 120-124/74-81 Chest pain- no    Dyspnea- no Medications  Compliance-  Taking losartan, HCTZ.  Edema- no  Obesity: Patient's weight has trended down recently. He is cut down on alcohol intake and has been altering his diet with healthier options and less sauces. He is also much more active with his daughter who is now playing soccer.  Fatty liver disease: Patient saw GI and lab evaluation was unremarkable. Ultrasound revealed fatty liver disease. He has been working on diet and exercise.     ROS: See pertinent positives and negatives per HPI.  Past Medical History:  Diagnosis Date  . Chicken pox   . Hypertension     Past Surgical History:  Procedure Laterality Date  . CYST REMOVAL HAND  2009   Benign cyst removed from right arm    Family History  Problem Relation Age of Onset  .  Hypertension Father   . Cancer Maternal Grandmother        breast    SOCIAL HX: Former smoker   Current Outpatient Medications:  .  EPINEPHrine 0.3 mg/0.3 mL IJ SOAJ injection, Inject 0.3 mLs (0.3 mg total) into the muscle as needed., Disp: 2 each, Rfl: 0 .  hydrochlorothiazide (HYDRODIURIL) 25 MG tablet, TAKE 1 TABLET BY MOUTH DAILY, Disp: 90 tablet, Rfl: 0 .  losartan (COZAAR) 100 MG tablet, TAKE 1 TABLET(100 MG) BY MOUTH DAILY, Disp: 90 tablet, Rfl: 1  EXAM:  VITALS per patient if applicable:  GENERAL: alert, oriented, appears well and in no acute distress  HEENT: atraumatic, conjunttiva clear, no obvious abnormalities on inspection of external nose and ears  NECK: normal movements of the head and neck  LUNGS: on inspection no signs of respiratory distress, breathing rate appears normal, no obvious gross SOB, gasping or wheezing  CV: no obvious cyanosis  MS: moves all visible extremities without noticeable abnormality  PSYCH/NEURO: pleasant and cooperative, no obvious depression or anxiety, speech and thought processing grossly intact  ASSESSMENT AND PLAN:  Discussed the following assessment and plan:  Essential hypertension Adequate control. Continue current regimen. Check CMP.  Fatty liver disease, nonalcoholic Continue with diet and exercise. We will check his liver function today.  Obesity Encouraged continued weight loss. Encouraged continued diet and exercise.   Orders Placed This Encounter  Procedures  . Comp Met (CMET)    Standing Status:   Future    Standing Expiration Date:   07/03/2021    No orders of  the defined types were placed in this encounter.    I discussed the assessment and treatment plan with the patient. The patient was provided an opportunity to ask questions and all were answered. The patient agreed with the plan and demonstrated an understanding of the instructions.   The patient was advised to call back or seek an in-person  evaluation if the symptoms worsen or if the condition fails to improve as anticipated.    Tommi Rumps, MD

## 2020-07-03 NOTE — Assessment & Plan Note (Signed)
Adequate control.  Continue current regimen.  Check CMP. 

## 2020-07-03 NOTE — Assessment & Plan Note (Signed)
Encouraged continued weight loss. Encouraged continued diet and exercise.

## 2020-07-05 ENCOUNTER — Telehealth: Payer: Self-pay | Admitting: Family Medicine

## 2020-07-05 NOTE — Telephone Encounter (Signed)
lvm to set up one week non fasting lab and 45m f/u with PCP

## 2020-09-09 ENCOUNTER — Other Ambulatory Visit: Payer: Self-pay | Admitting: Family Medicine

## 2020-12-06 ENCOUNTER — Other Ambulatory Visit: Payer: Self-pay | Admitting: Family Medicine

## 2021-03-04 ENCOUNTER — Other Ambulatory Visit: Payer: Self-pay | Admitting: Family Medicine

## 2021-06-01 ENCOUNTER — Other Ambulatory Visit: Payer: Self-pay | Admitting: Family Medicine

## 2021-07-02 ENCOUNTER — Other Ambulatory Visit: Payer: Self-pay | Admitting: Family Medicine

## 2021-07-02 DIAGNOSIS — I10 Essential (primary) hypertension: Secondary | ICD-10-CM

## 2021-07-03 NOTE — Telephone Encounter (Signed)
I need to see the lab results prior to refilling.

## 2021-07-04 LAB — BASIC METABOLIC PANEL
Creatinine: 1 (ref <=1.3)
Glucose: 90

## 2021-07-04 LAB — LIPID PANEL
Cholesterol: 162 (ref 0–200)
HDL: 44 (ref 35–70)
LDL Cholesterol: 100
LDl/HDL Ratio: 3.7
Triglycerides: 97 (ref 40–160)

## 2021-07-04 LAB — HEMOGLOBIN A1C: Hemoglobin A1C: 4.8

## 2021-07-07 NOTE — Telephone Encounter (Signed)
Patient dropped off his lab results today,they are upfront in Dr.Sonnenberg's color folder.

## 2021-07-07 NOTE — Telephone Encounter (Signed)
Called and spoke with Baldo Ash. Charleton states that he did not have a full panel of bloodwork done and he may need more labs drawn by our office. Pt is currently waiting to receive lab results and verbalized understanding that we were waiting on the results before refilling the medication.

## 2021-07-08 ENCOUNTER — Other Ambulatory Visit: Payer: Self-pay | Admitting: Family Medicine

## 2021-07-08 ENCOUNTER — Encounter: Payer: Self-pay | Admitting: Family Medicine

## 2021-07-08 DIAGNOSIS — E7800 Pure hypercholesterolemia, unspecified: Secondary | ICD-10-CM

## 2021-07-08 DIAGNOSIS — I10 Essential (primary) hypertension: Secondary | ICD-10-CM

## 2021-07-08 NOTE — Telephone Encounter (Signed)
Results have been received and placed in Dr. Kermit Balo Lab Results paperwork at his desk.

## 2021-07-08 NOTE — Telephone Encounter (Signed)
30 day supply sent to pharmacy. He needs additional labs for further refills as I need to see what his electrolytes are as well. Order placed. This can be done in the next week or so.

## 2021-07-11 NOTE — Telephone Encounter (Addendum)
Called and spoke with Shawn English. Shawn English verbalized understanding and scheduled to come in on 07/15/21 at 9:45am for a blood draw. Pt was instructed to come in early as he would only be available between 7:30 and 8:30.

## 2021-07-15 ENCOUNTER — Other Ambulatory Visit (INDEPENDENT_AMBULATORY_CARE_PROVIDER_SITE_OTHER): Payer: Self-pay

## 2021-07-15 ENCOUNTER — Other Ambulatory Visit: Payer: Self-pay

## 2021-07-15 DIAGNOSIS — E7800 Pure hypercholesterolemia, unspecified: Secondary | ICD-10-CM | POA: Diagnosis not present

## 2021-07-15 DIAGNOSIS — I10 Essential (primary) hypertension: Secondary | ICD-10-CM | POA: Diagnosis not present

## 2021-07-16 ENCOUNTER — Encounter: Payer: Self-pay | Admitting: Family Medicine

## 2021-07-16 LAB — BASIC METABOLIC PANEL
BUN/Creatinine Ratio: 17 (ref 9–20)
BUN: 17 mg/dL (ref 6–24)
CO2: 26 mmol/L (ref 20–29)
Calcium: 9.9 mg/dL (ref 8.7–10.2)
Chloride: 98 mmol/L (ref 96–106)
Creatinine, Ser: 1.03 mg/dL (ref 0.76–1.27)
Glucose: 83 mg/dL (ref 65–99)
Potassium: 4 mmol/L (ref 3.5–5.2)
Sodium: 141 mmol/L (ref 134–144)
eGFR: 92 mL/min/{1.73_m2} (ref >59)

## 2021-07-16 LAB — HEPATIC FUNCTION PANEL
ALT: 32 IU/L (ref 0–44)
AST: 22 IU/L (ref 0–40)
Albumin: 5 g/dL (ref 4.0–5.0)
Alkaline Phosphatase: 69 IU/L (ref 44–121)
Bilirubin Total: 0.7 mg/dL (ref 0.0–1.2)
Bilirubin, Direct: 0.2 mg/dL (ref 0.00–0.40)
Total Protein: 7.1 g/dL (ref 6.0–8.5)

## 2021-08-08 ENCOUNTER — Other Ambulatory Visit: Payer: Self-pay | Admitting: Family Medicine

## 2021-08-08 DIAGNOSIS — I10 Essential (primary) hypertension: Secondary | ICD-10-CM

## 2021-08-18 ENCOUNTER — Other Ambulatory Visit: Payer: Self-pay

## 2021-08-18 ENCOUNTER — Ambulatory Visit: Payer: BC Managed Care – PPO | Admitting: Family Medicine

## 2021-08-18 DIAGNOSIS — E6609 Other obesity due to excess calories: Secondary | ICD-10-CM | POA: Diagnosis not present

## 2021-08-18 DIAGNOSIS — L608 Other nail disorders: Secondary | ICD-10-CM | POA: Diagnosis not present

## 2021-08-18 DIAGNOSIS — I10 Essential (primary) hypertension: Secondary | ICD-10-CM | POA: Diagnosis not present

## 2021-08-18 DIAGNOSIS — Z6832 Body mass index (BMI) 32.0-32.9, adult: Secondary | ICD-10-CM

## 2021-08-18 NOTE — Assessment & Plan Note (Signed)
Undetermined cause.  Discussed this could represent onychomycosis.  We will refer to podiatry for further evaluation.

## 2021-08-18 NOTE — Patient Instructions (Signed)
Nice to see you. Please continue with diet and exercise. Podiatry should contact you.  If you will hear from them in 1 to 2 weeks please let me know.

## 2021-08-18 NOTE — Progress Notes (Signed)
Marikay Alar, MD Phone: [phone]  Shawn English is a 45 y.o. male who presents today for f/u  HYPERTENSION Disease Monitoring Home BP Monitoring <128/81 Chest pain- no    Dyspnea- no Medications Compliance-  taking losartan, HCTZ.   Edema- no BMET    Component Value Date/Time   NA 141 07/15/2021 0812   K 4.0 07/15/2021 0812   CL 98 07/15/2021 0812   CO2 26 07/15/2021 0812   GLUCOSE 83 07/15/2021 0812   GLUCOSE 89 08/08/2018 0826   BUN 17 07/15/2021 0812   CREATININE 1.03 07/15/2021 0812   CALCIUM 9.9 07/15/2021 0812   Obesity: Patient has gotten into the gym.  He is working out 5 days a week with cardio and weightlifting.  He is down about 18 pounds.  His diet is much healthier.  He has eggs and low sugar oatmeal with protein powder for breakfast.  He has brown rice, black beans, and chicken for lunch.  He has broccoli asparagus and other lean meats for dinner.  Toenail abnormality: This is on his left great toenail on the tibial aspect.  This has been present for 3 months.  He is able to scrape the abnormality golf though it comes back.  There is no associated pain.  Social History   Tobacco Use  Smoking Status Former   Types: Cigarettes   Quit date: 09/09/2014   Years since quitting: 6.9  Smokeless Tobacco Never    Current Outpatient Medications on File Prior to Visit  Medication Sig Dispense Refill   hydrochlorothiazide (HYDRODIURIL) 25 MG tablet TAKE 1 TABLET BY MOUTH DAILY 90 tablet 0   losartan (COZAAR) 100 MG tablet TAKE 1 TABLET(100 MG) BY MOUTH DAILY 90 tablet 1   EPINEPHrine 0.3 mg/0.3 mL IJ SOAJ injection Inject 0.3 mLs (0.3 mg total) into the muscle as needed. (Patient not taking: Reported on 08/18/2021) 2 each 0   No current facility-administered medications on file prior to visit.     ROS see history of present illness  Objective  Physical Exam Vitals:   08/18/21 1408  BP: 130/80  Pulse: 91  Temp: 98.7 F (37.1 C)  SpO2: 98%    BP  Readings from Last 3 Encounters:  08/18/21 130/80  07/03/20 124/81  03/14/20 (!) 162/114   Wt Readings from Last 3 Encounters:  08/18/21 203 lb 12.8 oz (92.4 kg)  07/03/20 196 lb (88.9 kg)  03/14/20 209 lb 3.2 oz (94.9 kg)    Physical Exam Constitutional:      General: He is not in acute distress.    Appearance: He is not diaphoretic.  Cardiovascular:     Rate and Rhythm: Normal rate and regular rhythm.     Heart sounds: Normal heart sounds.  Pulmonary:     Effort: Pulmonary effort is normal.     Breath sounds: Normal breath sounds.  Skin:    General: Skin is warm and dry.     Comments: Tibial aspect left great toenail slightly thickened and yellow/tan  Neurological:     Mental Status: He is alert.     Assessment/Plan: Please see individual problem list.  Problem List Items Addressed This Visit     Essential hypertension    Adequate control.  He will continue losartan 100 mg once daily and HCTZ 25 mg daily.  Check labs at next visit.  Follow-up 6 months.      Obesity    The patient has made significant diet and exercise changes.  I congratulated him on  this and encouraged him to continue with these.  This will be very helpful for his blood pressure, fatty liver disease, and cholesterol.      Toenail deformity    Undetermined cause.  Discussed this could represent onychomycosis.  We will refer to podiatry for further evaluation.      Relevant Orders   Ambulatory referral to Podiatry    Return in about 6 months (around 02/16/2022) for Hypertension.  This visit occurred during the SARS-CoV-2 public health emergency.  Safety protocols were in place, including screening questions prior to the visit, additional usage of staff PPE, and extensive cleaning of exam room while observing appropriate contact time as indicated for disinfecting solutions.    Marikay Alar, MD [facility]

## 2021-08-18 NOTE — Assessment & Plan Note (Signed)
The patient has made significant diet and exercise changes.  I congratulated him on this and encouraged him to continue with these.  This will be very helpful for his blood pressure, fatty liver disease, and cholesterol.

## 2021-08-18 NOTE — Assessment & Plan Note (Signed)
Adequate control.  He will continue losartan 100 mg once daily and HCTZ 25 mg daily.  Check labs at next visit.  Follow-up 6 months.

## 2021-08-27 ENCOUNTER — Encounter: Payer: Self-pay | Admitting: Podiatry

## 2021-08-27 ENCOUNTER — Other Ambulatory Visit: Payer: Self-pay

## 2021-08-27 ENCOUNTER — Ambulatory Visit: Payer: BC Managed Care – PPO | Admitting: Podiatry

## 2021-08-27 DIAGNOSIS — B351 Tinea unguium: Secondary | ICD-10-CM | POA: Diagnosis not present

## 2021-08-27 MED ORDER — TERBINAFINE HCL 250 MG PO TABS: 250.0000 mg | ORAL_TABLET | Freq: Every day | ORAL | 0 refills | Status: DC

## 2021-08-27 NOTE — Progress Notes (Signed)
  Subjective:  Patient ID: Shawn English, male    DOB: February 08, 1976,  MRN: [id number]  Chief Complaint  Patient presents with   Nail Problem    left foot big toe nail fungus    45 y.o. male presents with the above complaint. History confirmed with patient.  Been going on for some time mostly on the left hallux nail.  He has tried multiple topical over-the-counter medications that have not been successful  Objective:  Physical Exam: warm, good capillary refill, no trophic changes or ulcerative lesions, normal DP and PT pulses, and normal sensory exam. Left Foot: Onychomycosis on the medial portion of the hallux nail, possible fourth toe as well Right Foot: Nails appear normal, some dystrophy of the third toenail    Assessment:   1. Onychomycosis      Plan:  Patient was evaluated and treated and all questions answered.  I reviewed the etiology and treatment options for onychomycosis with the patient in detail.  We discussed oral and topical treatment.  I recommended oral treatment with terbinafine.  We discussed the risk benefits and potential side effects of this medication.  He did have a history of nonalcoholic hepatic steatosis, this is improved quite a bit since he made lifestyle modifications in the last year and he is lost quite a bit of weight.  I reviewed his last LFTs which were normal.  I would like to check his LFTs after the next visit after he has been on the medication.  Advised him this may take 2-3 rounds of Lamisil to improve.  Photographs were taken today  Return in about 4 months (around 12/28/2021) for follow up after nail fungus treatment.

## 2021-10-13 ENCOUNTER — Encounter: Payer: Self-pay | Admitting: Family Medicine

## 2021-10-13 DIAGNOSIS — I10 Essential (primary) hypertension: Secondary | ICD-10-CM

## 2021-10-13 MED ORDER — LOSARTAN POTASSIUM 100 MG PO TABS: ORAL_TABLET | ORAL | 1 refills | Status: DC

## 2021-11-05 ENCOUNTER — Other Ambulatory Visit: Payer: Self-pay | Admitting: Family Medicine

## 2021-11-05 DIAGNOSIS — I10 Essential (primary) hypertension: Secondary | ICD-10-CM

## 2021-11-27 ENCOUNTER — Other Ambulatory Visit: Payer: Self-pay | Admitting: Podiatry

## 2021-11-27 NOTE — Telephone Encounter (Signed)
Please advise 

## 2021-12-04 NOTE — Progress Notes (Signed)
[name] [address] [place] [address] Phone: [phone] Subjective:   INadine Counts, am serving as a scribe for Dr. Antoine Primas. This visit occurred during the SARS-CoV-2 public health emergency.  Safety protocols were in place, including screening questions prior to the visit, additional usage of staff PPE, and extensive cleaning of exam room while observing appropriate contact time as indicated for disinfecting solutions.   I'm seeing this patient by the request  of:  Glori Luis, MD  CC: Left elbow and left shoulder pain  [id number]  Shawn English is a 46 y.o. male coming in with complaint of L shoulder and L elbow pain. Last seen in 2018 for cervical radiculopathy. Patient states shoulder pain began in early dec. MOI was doing bench press felt pain when coming down and powered through the coming up. Pain on posteior side and says he can feel it in his armpit. Elbow MOI was at beach threw 46 year old bach to shore and felt a pop. That occurred in September. No intervention has helped. Location on lateral side. Loss of grip strength.       Past Medical History:  Diagnosis Date   Chicken pox    Hypertension    Past Surgical History:  Procedure Laterality Date   CYST REMOVAL HAND  2009   Benign cyst removed from right arm   Social History   Socioeconomic History   Marital status: Married    Spouse name: Not on file   Number of children: Not on file   Years of education: Not on file   Highest education level: Not on file  Occupational History   Not on file  Tobacco Use   Smoking status: Former    Types: Cigarettes    Quit date: 09/09/2014    Years since quitting: 7.2   Smokeless tobacco: Never  Substance and Sexual Activity   Alcohol use: Yes    Alcohol/week: 1.0 standard drink    Types: 1 Standard drinks or equivalent per week    Comment: Occasional use    Drug use: No   Sexual activity: Yes    Partners: Female     Comment: Fiance  Other Topics Concern   Not on file  Social History Narrative   Lives with fiance    Child on the way, girl, due in July 2016    No Pets    Work at [employer]    Left handed   Caffeine- 2 cups of coffee, occasional soda    Enjoys playing guitar and traveling    Social Determinants of Corporate investment banker Strain: Not on file  Food Insecurity: Not on file  Transportation Needs: Not on file  Physical Activity: Not on file  Stress: Not on file  Social Connections: Not on file   Allergies  Allergen Reactions   Bee Venom Swelling   Fish-Derived Products Anaphylaxis    Fish oil included   Penicillins Anaphylaxis   Sulfa Antibiotics Nausea And Vomiting   Family History  Problem Relation Age of Onset   Hypertension Father    Cancer Maternal Grandmother        breast     Current Outpatient Medications (Cardiovascular):    nitroGLYCERIN (NITRO-DUR) 0.2 mg/hr patch, Apply 1/4 of a patch to skin once daily.   EPINEPHrine 0.3 mg/0.3 mL IJ SOAJ injection, Inject 0.3 mLs (0.3 mg total) into the muscle as needed. (Patient not taking:  Reported on 08/18/2021)   hydrochlorothiazide (HYDRODIURIL) 25 MG tablet, TAKE 1 TABLET BY MOUTH DAILY   losartan (COZAAR) 100 MG tablet, Take 1 tablet (100 mg) by mouth daily.     Current Outpatient Medications (Other):    terbinafine (LAMISIL) 250 MG tablet, TAKE 1 TABLET(250 MG) BY MOUTH DAILY   Reviewed prior external information including notes and imaging from  primary care provider As well as notes that were available from care everywhere and other healthcare systems.  Past medical history, social, surgical and family history all reviewed in electronic medical record.  No pertanent information unless stated regarding to the chief complaint.   Review of Systems:  No headache, visual changes, nausea, vomiting, diarrhea, constipation, dizziness, abdominal pain, skin rash, fevers, chills, night  sweats, weight loss, swollen lymph nodes, body aches, joint swelling, chest pain, shortness of breath, mood changes. POSITIVE muscle aches  Objective  Blood pressure 128/90, pulse (!) 106, height 5\' 6"  (1.676 m), weight 203 lb (92.1 kg), SpO2 98 %.   General: No apparent distress alert and oriented x3 mood and affect normal, dressed appropriately.  HEENT: Pupils equal, extraocular movements intact  Respiratory: Patient's speak in full sentences and does not appear short of breath  Cardiovascular: No lower extremity edema, non tender, no erythema  Gait normal with good balance and coordination.  MSK: Neck exam does have some mild loss lordosis.  Shoulder exam does have positive impingement noted.  Empty can cause severe pain but no significant weakness.  He does lack the last 10 degrees of internal and external range of motion.  Patient is tender to palpation over the lateral epicondylar region.  Worsening pain with wrist extension.  Good grip strength noted.  Negative Tinel's.  Nontender to palpation of the lateral epicondylar region.  Patient does have some pain with resisted extension of the wrist.  Limited muscular skeletal ultrasound was performed and interpreted by , M  Limited ultrasound of patient's shoulder shows the patient does have hypoechoic changes noted patient does have what appears to be a partial tear noted.  This is of the supraspinatus..  No significant retraction Impression: Partial tearing noted of the rotator cuff  [id number]; 15 additional minutes spent for Therapeutic exercises as stated in above notes.  This included exercises focusing on stretching, strengthening, with significant focus on eccentric aspects.   Long term goals include an improvement in range of motion, strength, endurance as well as avoiding reinjury. Patient's frequency would include in 1-2 times a day, 3-5 times a week for a duration of 6-12 weeks. Shoulder Exercises that included:  Basic  scapular stabilization to include adduction and depression of scapula Scaption, focusing on proper movement and good control Internal and External rotation utilizing a theraband, with elbow tucked at side entire time Rows with theraband   Proper technique shown and discussed handout in great detail with ATC.  All questions were discussed and answered.      Impression and Recommendations:     The above documentation has been reviewed and is accurate and complete Antoine Primas, DO

## 2021-12-08 ENCOUNTER — Ambulatory Visit: Payer: BC Managed Care – PPO | Admitting: Family Medicine

## 2021-12-08 ENCOUNTER — Ambulatory Visit: Payer: Self-pay

## 2021-12-08 ENCOUNTER — Ambulatory Visit (INDEPENDENT_AMBULATORY_CARE_PROVIDER_SITE_OTHER): Payer: BC Managed Care – PPO

## 2021-12-08 ENCOUNTER — Encounter: Payer: Self-pay | Admitting: Family Medicine

## 2021-12-08 ENCOUNTER — Other Ambulatory Visit: Payer: Self-pay

## 2021-12-08 VITALS — BP 128/90 | HR 106 | Ht 66.0 in | Wt 203.0 lb

## 2021-12-08 DIAGNOSIS — S46012A Strain of muscle(s) and tendon(s) of the rotator cuff of left shoulder, initial encounter: Secondary | ICD-10-CM

## 2021-12-08 DIAGNOSIS — M7712 Lateral epicondylitis, left elbow: Secondary | ICD-10-CM

## 2021-12-08 DIAGNOSIS — M25512 Pain in left shoulder: Secondary | ICD-10-CM

## 2021-12-08 DIAGNOSIS — M75102 Unspecified rotator cuff tear or rupture of left shoulder, not specified as traumatic: Secondary | ICD-10-CM | POA: Insufficient documentation

## 2021-12-08 DIAGNOSIS — M25522 Pain in left elbow: Secondary | ICD-10-CM

## 2021-12-08 MED ORDER — NITROGLYCERIN 0.2 MG/HR TD PT24: MEDICATED_PATCH | TRANSDERMAL | 0 refills | Status: DC

## 2021-12-08 NOTE — Assessment & Plan Note (Signed)
Elbow anatomy was reviewed, and tendinopathy was explained.  Pt. given a home rehab program. Start with isometrics and ROM, then a series of concentric and eccentric exercises should be done starting with no weight, work up to 1 lb, hammer, etc.  Use counterforce strap if working or using hands.  Formal PT would be beneficial. Emphasized stretching an cross-friction massage Emphasized proper palms up lifting biomechanics to unload ECRB Patient to do a wrist brace as well follow-up with me again in 4 to 6 weeks worsening pain consider formal physical therapy

## 2021-12-08 NOTE — Patient Instructions (Addendum)
Nitro patches Do prescribed exercises at least 3x a week Ice 20 minutes 2 times daily. Usually after activity and before bed. Wrist brace Avoid overhead lifting Careful on computer See you again in 4-6 weeks   Nitroglycerin Protocol   Apply 1/4 nitroglycerin patch to affected area daily.  Change position of patch within the affected area every 24 hours.  You may experience a headache during the first 1-2 weeks of using the patch, these should subside.  If you experience headaches after beginning nitroglycerin patch treatment, you may take your preferred over the counter pain reliever.  Another side effect of the nitroglycerin patch is skin irritation or rash related to patch adhesive.  Please notify our office if you develop more severe headaches or rash, and stop the patch.  Tendon healing with nitroglycerin patch may require 12 to 24 weeks depending on the extent of injury.  Men should not use if taking Viagra, Cialis, or Levitra.   Do not use if you have migraines or rosacea.

## 2021-12-08 NOTE — Assessment & Plan Note (Signed)
Patient is over 1 month into the chair.  Patient likely does not have weakness but does have significant pain.  We discussed which activities to do and which ones to avoid.  Patient will start on nitroglycerin.  Warned of potential side effects.  Patient is willing to try it.  Discussed increasing activity slowly.  Patient will keep hands within peripheral vision.  Follow-up with me again in 4 to 6 weeks.

## 2021-12-29 ENCOUNTER — Ambulatory Visit: Payer: BC Managed Care – PPO | Admitting: Podiatry

## 2021-12-29 ENCOUNTER — Encounter: Payer: Self-pay | Admitting: Podiatry

## 2021-12-29 ENCOUNTER — Other Ambulatory Visit: Payer: Self-pay

## 2021-12-29 DIAGNOSIS — B351 Tinea unguium: Secondary | ICD-10-CM | POA: Diagnosis not present

## 2021-12-29 MED ORDER — FLUCONAZOLE 150 MG PO TABS: 150.0000 mg | ORAL_TABLET | Freq: Once | ORAL | 0 refills | Status: AC

## 2021-12-29 MED ORDER — EFINACONAZOLE 10 % EX SOLN: 1.0000 [drp] | Freq: Every day | CUTANEOUS | 11 refills | Status: DC

## 2021-12-29 NOTE — Progress Notes (Signed)
°  Subjective:  Patient ID: Shawn English, male    DOB: [birth year],  MRN: [id number]  Chief Complaint  Patient presents with   Nail Problem    Follow up nail fungus   "I can't tell any difference yet"    46 y.o. male presents with the above complaint. History confirmed with patient.  Has not noticed much difference he has completed all the terbinafine  Objective:  Physical Exam: warm, good capillary refill, no trophic changes or ulcerative lesions, normal DP and PT pulses, normal sensory exam, and onychomycosis.    Assessment:   1. Onychomycosis      Plan:  Patient was evaluated and treated and all questions answered.  Not much improvement so far with a 70-month course of terbinafine.  Discussed further treatment with alternate agents.  I prescribed him fluconazole for 65-month course weekly 150 mg.  Also added topical treatment with efinaconazole and gave him information on the 0 co-pay program.  I will see him back in 4 months for reevaluation.  New photographs taken today.  Return in about 4 months (around 04/28/2022) for follow up after nail fungus treatment.

## 2021-12-29 NOTE — Patient Instructions (Signed)
Go to www.[email or web address].com for the [place] Co-Pay program

## 2022-01-02 NOTE — Progress Notes (Signed)
[name] [address] [place] [address] Phone: [phone] Subjective:   Bruce Donath, am serving as a scribe for Dr. Antoine Primas.  This visit occurred during the SARS-CoV-2 public health emergency.  Safety protocols were in place, including screening questions prior to the visit, additional usage of staff PPE, and extensive cleaning of exam room while observing appropriate contact time as indicated for disinfecting solutions.    I'm seeing this patient by the request  of:  Glori Luis, MD  CC: Left shoulder and left elbow pain follow-up  [id number]  12/08/2021 Elbow anatomy was reviewed, and tendinopathy was explained.   Pt. given a home rehab program. Start with isometrics and ROM, then a series of concentric and eccentric exercises should be done starting with no weight, work up to 1 lb, hammer, etc.   Use counterforce strap if working or using hands.   Formal PT would be beneficial. Emphasized stretching an cross-friction massage Emphasized proper palms up lifting biomechanics to unload ECRB Patient to do a wrist brace as well follow-up with me again in 4 to 6 weeks worsening pain consider formal physical therapy  Update 01/02/2022 Henrick Mcgue is a 46 y.o. male coming in with complaint of L elbow and L shoulder pain. Patient states that he has been doing the HEP and wearing cock-up wrist splint. No change in pain since last visit. Using nitro patches on shoulder.        Past Medical History:  Diagnosis Date   Chicken pox    Hypertension    Past Surgical History:  Procedure Laterality Date   CYST REMOVAL HAND  2009   Benign cyst removed from right arm   Social History   Socioeconomic History   Marital status: Married    Spouse name: Not on file   Number of children: Not on file   Years of education: Not on file   Highest education level: Not on file  Occupational History   Not on file  Tobacco Use    Smoking status: Former    Types: Cigarettes    Quit date: 09/09/2014    Years since quitting: 7.3   Smokeless tobacco: Never  Substance and Sexual Activity   Alcohol use: Yes    Alcohol/week: 1.0 standard drink    Types: 1 Standard drinks or equivalent per week    Comment: Occasional use    Drug use: No   Sexual activity: Yes    Partners: Female    Comment: Fiance  Other Topics Concern   Not on file  Social History Narrative   Lives with fiance    Child on the way, girl, due in July 2016    No Pets    Work at [employer]    Left handed   Caffeine- 2 cups of coffee, occasional soda    Enjoys playing guitar and traveling    Social Determinants of Corporate investment banker Strain: Not on file  Food Insecurity: Not on file  Transportation Needs: Not on file  Physical Activity: Not on file  Stress: Not on file  Social Connections: Not on file   Allergies  Allergen Reactions   Bee Venom Swelling   Fish-Derived Products Anaphylaxis    Fish oil included   Penicillins Anaphylaxis   Sulfa Antibiotics Nausea And Vomiting   Family History  Problem Relation Age of Onset   Hypertension Father    Cancer Maternal Grandmother  breast     Current Outpatient Medications (Cardiovascular):    EPINEPHrine 0.3 mg/0.3 mL IJ SOAJ injection, Inject 0.3 mLs (0.3 mg total) into the muscle as needed.   hydrochlorothiazide (HYDRODIURIL) 25 MG tablet, TAKE 1 TABLET BY MOUTH DAILY   losartan (COZAAR) 100 MG tablet, Take 1 tablet (100 mg) by mouth daily.   nitroGLYCERIN (NITRO-DUR) 0.2 mg/hr patch, Apply 1/4 of a patch to skin once daily.     Current Outpatient Medications (Other):    Efinaconazole 10 % SOLN, Apply 1 drop topically daily.   terbinafine (LAMISIL) 250 MG tablet, TAKE 1 TABLET(250 MG) BY MOUTH DAILY   Reviewed prior external information including notes and imaging from  primary care provider As well as notes that were available from care  everywhere and other healthcare systems.  Past medical history, social, surgical and family history all reviewed in electronic medical record.  No pertanent information unless stated regarding to the chief complaint.   Review of Systems:  No headache, visual changes, nausea, vomiting, diarrhea, constipation, dizziness, abdominal pain, skin rash, fevers, chills, night sweats, weight loss, swollen lymph nodes, body aches, joint swelling, chest pain, shortness of breath, mood changes. POSITIVE muscle aches  Objective  Blood pressure 116/90, pulse (!) 110, height 5\' 6"  (1.676 m), weight 207 lb (93.9 kg), SpO2 98 %.   General: No apparent distress alert and oriented x3 mood and affect normal, dressed appropriately.  HEENT: Pupils equal, extraocular movements intact  Respiratory: Patient's speak in full sentences and does not appear short of breath  Cardiovascular: No lower extremity edema, non tender, no erythema  Gait normal with good balance and coordination.  MSK: Patient's left shoulder does have a positive impingement noted.  Positive crossover noted as well.  Patient has weakness noted with 4 out of 5 strength of the rotator cuff compared to the contralateral side.  Positive O'Brien's.  Left elbow exam does show improvement.  Still has some mild discomfort noted with full extension.  Limited muscular skeletal ultrasound was performed and interpreted by , M  Limited ultrasound of patient's left shoulder does show that patient does have hypoechoic changes in the shoulder region.  Patient does have what appears to be a intersubstance tear noted of the supraspinatus.  Moderate hypoechoic changes of the acromioclavicular joint also noted. Impression:No significant improvement in the partial tearing of the rotator cuff on the left side.    Impression and Recommendations:     The above documentation has been reviewed and is accurate and complete Antoine Primas, DO

## 2022-01-05 ENCOUNTER — Ambulatory Visit: Payer: Self-pay

## 2022-01-05 ENCOUNTER — Ambulatory Visit: Payer: BC Managed Care – PPO | Admitting: Family Medicine

## 2022-01-05 ENCOUNTER — Other Ambulatory Visit: Payer: Self-pay

## 2022-01-05 ENCOUNTER — Encounter: Payer: Self-pay | Admitting: Family Medicine

## 2022-01-05 VITALS — BP 116/90 | HR 110 | Ht 66.0 in | Wt 207.0 lb

## 2022-01-05 DIAGNOSIS — M25512 Pain in left shoulder: Secondary | ICD-10-CM | POA: Diagnosis not present

## 2022-01-05 DIAGNOSIS — S46012A Strain of muscle(s) and tendon(s) of the rotator cuff of left shoulder, initial encounter: Secondary | ICD-10-CM | POA: Diagnosis not present

## 2022-01-05 DIAGNOSIS — M25522 Pain in left elbow: Secondary | ICD-10-CM | POA: Diagnosis not present

## 2022-01-05 DIAGNOSIS — G8929 Other chronic pain: Secondary | ICD-10-CM | POA: Diagnosis not present

## 2022-01-05 NOTE — Assessment & Plan Note (Signed)
Chronic problem at this point with no significant improvement.  Patient's ultrasound still shows that there is at least partial tearing noted.  Patient has failed all conservative therapy at this time and I do not feel that advanced imaging is warranted.

## 2022-01-05 NOTE — Patient Instructions (Signed)
MRA L shoulder [phone] We will be in contact with you  Elbow looks to be improving Have appt in 6 weeks for elbow

## 2022-01-21 ENCOUNTER — Ambulatory Visit
Admission: RE | Admit: 2022-01-21 | Discharge: 2022-01-21 | Disposition: A | Discharge status: Home / Self Care | Payer: BC Managed Care – PPO | Source: Ambulatory Visit | Attending: Family Medicine | Admitting: Family Medicine

## 2022-01-21 DIAGNOSIS — G8929 Other chronic pain: Secondary | ICD-10-CM

## 2022-01-21 DIAGNOSIS — M25512 Pain in left shoulder: Secondary | ICD-10-CM | POA: Diagnosis not present

## 2022-01-21 DIAGNOSIS — S46012A Strain of muscle(s) and tendon(s) of the rotator cuff of left shoulder, initial encounter: Secondary | ICD-10-CM | POA: Diagnosis not present

## 2022-01-21 MED ORDER — IOPAMIDOL (ISOVUE-M 300) INJECTION 61%
12.0000 mL | Freq: Once | INTRAMUSCULAR | Status: AC | PRN
  Administered 2022-01-21: 12 mL via INTRA_ARTICULAR

## 2022-01-23 ENCOUNTER — Encounter: Payer: Self-pay | Admitting: Family Medicine

## 2022-02-02 ENCOUNTER — Other Ambulatory Visit: Payer: Self-pay | Admitting: Family Medicine

## 2022-02-02 DIAGNOSIS — I10 Essential (primary) hypertension: Secondary | ICD-10-CM

## 2022-02-06 DIAGNOSIS — M25512 Pain in left shoulder: Secondary | ICD-10-CM | POA: Diagnosis not present

## 2022-02-16 ENCOUNTER — Ambulatory Visit: Payer: BC Managed Care – PPO | Admitting: Family Medicine

## 2022-02-16 ENCOUNTER — Encounter: Payer: Self-pay | Admitting: Family Medicine

## 2022-02-16 DIAGNOSIS — Z6834 Body mass index (BMI) 34.0-34.9, adult: Secondary | ICD-10-CM

## 2022-02-16 DIAGNOSIS — E6609 Other obesity due to excess calories: Secondary | ICD-10-CM

## 2022-02-16 DIAGNOSIS — S46012A Strain of muscle(s) and tendon(s) of the rotator cuff of left shoulder, initial encounter: Secondary | ICD-10-CM | POA: Diagnosis not present

## 2022-02-16 DIAGNOSIS — I10 Essential (primary) hypertension: Secondary | ICD-10-CM

## 2022-02-16 DIAGNOSIS — L608 Other nail disorders: Secondary | ICD-10-CM | POA: Diagnosis not present

## 2022-02-16 NOTE — Patient Instructions (Signed)
Nice to see you. ?Please get back to exercising as advised by your orthopedic team. ?We will check lab work today. ?

## 2022-02-16 NOTE — Assessment & Plan Note (Signed)
Adequately controlled.  He will continue HCTZ 25 mg once daily and losartan 100 mg daily.  Check lab work. ?

## 2022-02-16 NOTE — Progress Notes (Signed)
?Shawn Rumps, MD ?Phone: [phone] ? ?Shawn English is a 46 y.o. male who presents today for f/u. ? ?HYPERTENSION ?Disease Monitoring ?Home BP Monitoring generally <120/83 Chest pain- no    Dyspnea- no ?Medications ?Compliance-  taking HCTZ, losartan.   Edema- no ?BMET ?   ?Component Value Date/Time  ? NA 141 07/15/2021 0812  ? K 4.0 07/15/2021 0812  ? CL 98 07/15/2021 0812  ? CO2 26 07/15/2021 0812  ? GLUCOSE 83 07/15/2021 0812  ? GLUCOSE 89 08/08/2018 0826  ? BUN 17 07/15/2021 0812  ? CREATININE 1.03 07/15/2021 0812  ? CALCIUM 9.9 07/15/2021 0812  ? ?Obesity: Patient notes exercise has been difficult given a left rotator cuff and labral tear.  He has been cleared to get back in the gym for lower body work.  He notes his diet has not been as good since he has had the shoulder issue.  He is also dealing with his daughter who has a significant dairy intolerance.  They are trying to work out a diet that works for her and them. ? ?Onychomycosis: Patient reports he is on terbinafine once weekly and a daily topical treatment through podiatry.  He notes things are improving with this. ? ?Left shoulder issues: Patient was diagnosed with a partial left rotator cuff tear and labral tear.  He has seen sports medicine and the surgeon and has been doing physical therapy.  He follows up with the surgeon in the near future to help determine if surgery will be needed. ? ?Social History  ? ?Tobacco Use  ?Smoking Status Former  ? Types: Cigarettes  ? Quit date: 09/09/2014  ? Years since quitting: 7.4  ?Smokeless Tobacco Never  ? ? ?Current Outpatient Medications on File Prior to Visit  ?Medication Sig Dispense Refill  ? Efinaconazole 10 % SOLN Apply 1 drop topically daily. 4 mL 11  ? EPINEPHrine 0.3 mg/0.3 mL IJ SOAJ injection Inject 0.3 mLs (0.3 mg total) into the muscle as needed. 2 each 0  ? hydrochlorothiazide (HYDRODIURIL) 25 MG tablet TAKE 1 TABLET BY MOUTH DAILY 90 tablet 0  ? losartan (COZAAR) 100 MG tablet Take 1 tablet  (100 mg) by mouth daily. 90 tablet 1  ? nitroGLYCERIN (NITRO-DUR) 0.2 mg/hr patch Apply 1/4 of a patch to skin once daily. 30 patch 0  ? terbinafine (LAMISIL) 250 MG tablet TAKE 1 TABLET(250 MG) BY MOUTH DAILY 90 tablet 0  ? ?No current facility-administered medications on file prior to visit.  ? ? ? ?ROS see history of present illness ? ?Objective ? ?Physical Exam ?Vitals:  ? 02/16/22 0912  ?BP: 118/80  ?Pulse: (!) 101  ?SpO2: 98%  ? ? ?BP Readings from Last 3 Encounters:  ?02/16/22 118/80  ?01/05/22 116/90  ?12/08/21 128/90  ? ?Wt Readings from Last 3 Encounters:  ?02/16/22 211 lb (95.7 kg)  ?01/05/22 207 lb (93.9 kg)  ?12/08/21 203 lb (92.1 kg)  ? ? ?Physical Exam ?Constitutional:   ?   General: He is not in acute distress. ?   Appearance: He is not diaphoretic.  ?Cardiovascular:  ?   Rate and Rhythm: Normal rate and regular rhythm.  ?   Heart sounds: Normal heart sounds.  ?Pulmonary:  ?   Effort: Pulmonary effort is normal.  ?   Breath sounds: Normal breath sounds.  ?Musculoskeletal:  ?   Right lower leg: No edema.  ?   Left lower leg: No edema.  ?Skin: ?   General: Skin is warm and dry.  ?  Neurological:  ?   Mental Status: He is alert.  ? ? ? ?Assessment/Plan: Please see individual problem list. ? ?Problem List Items Addressed This Visit   ? ? Essential hypertension (Chronic)  ?  Adequately controlled.  He will continue HCTZ 25 mg once daily and losartan 100 mg daily.  Check lab work. ?  ?  ? Relevant Orders  ? Comp Met (CMET)  ? Left rotator cuff tear (Chronic)  ?  He will continue with evaluation and management by sports medicine and orthopedics. ?  ?  ? Obesity (Chronic)  ?  I encouraged him to get back to exercising as advised by his orthopedic team.  Discussed monitoring his diet as well. ?  ?  ? Toenail deformity  ?  Currently being treated for onychomycosis by podiatry.  We will check liver enzymes. ?  ?  ? ? ? ?Return in about 6 months (around 08/18/2022) for cpe. ? ?This visit occurred during the  SARS-CoV-2 public health emergency.  Safety protocols were in place, including screening questions prior to the visit, additional usage of staff PPE, and extensive cleaning of exam room while observing appropriate contact time as indicated for disinfecting solutions.  ? ? ?Shawn Rumps, MD ?[place] ? ?

## 2022-02-16 NOTE — Progress Notes (Signed)
?Charlann Boxer D.O. ?[facility] ?[place] ?Phone: [phone] ?Subjective:   ?I, Jacqualin Combes, am serving as a scribe for Dr. Hulan Saas. ? ?This visit occurred during the SARS-CoV-2 public health emergency.  Safety protocols were in place, including screening questions prior to the visit, additional usage of staff PPE, and extensive cleaning of exam room while observing appropriate contact time as indicated for disinfecting solutions.  ? ? ?I'm seeing this patient by the request  of:  Leone Haven, MD ? ?CC: Left shoulder and left elbow pain ? ?[id number]  ?01/05/2022 ?Chronic problem at this point with no significant improvement.  Patient's ultrasound still shows that there is at least partial tearing noted.  Patient has failed all conservative therapy at this time and I do not feel that advanced imaging is warranted.  ? ?Update 02/17/2022 ?Tagan Roughton is a 46 y.o. male coming in with complaint of L shoulder and L elbow pain. Patient states that elbow pain has improved. Only having pain with lifting heavy items overhand.  ? ?The shoulder is the same as last visit.  ? ?MRI L shoulder 01/21/2022 ?IMPRESSION: ?1. Mild supraspinatus and infraspinatus tendinosis with small ?low-grade rim rent tear along the anterior supraspinatus tendon ?insertion. No full-thickness or retracted rotator cuff tear. ?2. Irregular tear of the superior to posterosuperior labrum. ?3. Mild degenerative changes of the [employer] joint. ?  ?  ? ?Past Medical History:  ?Diagnosis Date  ? Chicken pox   ? Hypertension   ? ?Past Surgical History:  ?Procedure Laterality Date  ? CYST REMOVAL HAND  2009  ? Benign cyst removed from right arm  ? ?Social History  ? ?Socioeconomic History  ? Marital status: Married  ?  Spouse name: Not on file  ? Number of children: Not on file  ? Years of education: Not on file  ? Highest education level: Not on file  ?Occupational History  ? Not on file  ?Tobacco Use  ?  Smoking status: Former  ?  Types: Cigarettes  ?  Quit date: 09/09/2014  ?  Years since quitting: 7.4  ? Smokeless tobacco: Never  ?Substance and Sexual Activity  ? Alcohol use: Yes  ?  Alcohol/week: 1.0 standard drink  ?  Types: 1 Standard drinks or equivalent per week  ?  Comment: Occasional use   ? Drug use: No  ? Sexual activity: Yes  ?  Partners: Female  ?  Comment: Fiance  ?Other Topics Concern  ? Not on file  ?Social History Narrative  ? Lives with fiance   ? Child on the way, girl, due in July 2016   ? No Pets   ? Work at [employer]   ? College   ? Left handed  ? Caffeine- 2 cups of coffee, occasional soda   ? Enjoys playing guitar and traveling   ? ?Social Determinants of Health  ? ?Financial Resource Strain: Not on file  ?Food Insecurity: Not on file  ?Transportation Needs: Not on file  ?Physical Activity: Not on file  ?Stress: Not on file  ?Social Connections: Not on file  ? ?Allergies  ?Allergen Reactions  ? Bee Venom Swelling  ? Fish-Derived Products Anaphylaxis  ?  Fish oil included  ? Penicillins Anaphylaxis  ? Sulfa Antibiotics Nausea And Vomiting  ? ?Family History  ?Problem Relation Age of Onset  ? Hypertension Father   ? Cancer Maternal Grandmother   ?     breast  ? ? ? ?  Current Outpatient Medications (Cardiovascular):  ?  EPINEPHrine 0.3 mg/0.3 mL IJ SOAJ injection, Inject 0.3 mLs (0.3 mg total) into the muscle as needed. ?  hydrochlorothiazide (HYDRODIURIL) 25 MG tablet, TAKE 1 TABLET BY MOUTH DAILY ?  losartan (COZAAR) 100 MG tablet, Take 1 tablet (100 mg) by mouth daily. ?  nitroGLYCERIN (NITRO-DUR) 0.2 mg/hr patch, Apply 1/4 of a patch to skin once daily. ? ? ? ? ?Current Outpatient Medications (Other):  ?  Efinaconazole 10 % SOLN, Apply 1 drop topically daily. ?  terbinafine (LAMISIL) 250 MG tablet, TAKE 1 TABLET(250 MG) BY MOUTH DAILY ? ? ?Reviewed prior external information including notes and imaging from  ?primary care provider ?As well as notes that were available from care  everywhere and other healthcare systems. ? ?Past medical history, social, surgical and family history all reviewed in electronic medical record.  No pertanent information unless stated regarding to the chief complaint.  ? ?Review of Systems: ? No headache, visual changes, nausea, vomiting, diarrhea, constipation, dizziness, abdominal pain, skin rash, fevers, chills, night sweats, weight loss, swollen lymph nodes, body aches, joint swelling, chest pain, shortness of breath, mood changes. POSITIVE muscle aches ? ?Objective  ?Blood pressure (!) 138/104, pulse 98, height 5\' 6"  (1.676 m), weight 210 lb (95.3 kg), SpO2 98 %. ?  ?General: No apparent distress alert and oriented x3 mood and affect normal, dressed appropriately.  ?HEENT: Pupils equal, extraocular movements intact  ?Respiratory: Patient's speak in full sentences and does not appear short of breath  ?Cardiovascular: No lower extremity edema, non tender, no erythema  ?Gait normal with good balance and coordination.  ?MSK: On exam patient's left shoulder patient does still have positive O'Brien's noted.  Patient's rotator cuff is 4+ out of 5 strength compared to the contralateral side.  Patient does have positive Neer and Hawkins still noted. ? ?Left elbow does have some tenderness to palpation over the lateral epicondylar region but significant improvement.  Improvement in strength with resisted wrist extension.  Good grip strength noted. ? ?Limited muscular skeletal ultrasound was performed and interpreted by Hulan Saas, M  ?Ultrasound of patient's elbow area and the common extensor tendon does not show any significant tearing at the moment, no hypoechoic changes.  Patient has significant improvement from previous exam. ?Impression: Interval improvement ?  ?Impression and Recommendations:  ?  ? ?The above documentation has been reviewed and is accurate and complete Lyndal Pulley, DO ? ? ? ?

## 2022-02-16 NOTE — Assessment & Plan Note (Signed)
He will continue with evaluation and management by sports medicine and orthopedics. ?

## 2022-02-16 NOTE — Assessment & Plan Note (Signed)
I encouraged him to get back to exercising as advised by his orthopedic team.  Discussed monitoring his diet as well. ?

## 2022-02-16 NOTE — Assessment & Plan Note (Signed)
Currently being treated for onychomycosis by podiatry.  We will check liver enzymes. ?

## 2022-02-17 ENCOUNTER — Other Ambulatory Visit: Payer: Self-pay

## 2022-02-17 LAB — COMPREHENSIVE METABOLIC PANEL
ALT: 43 IU/L (ref 0–44)
AST: 19 IU/L (ref 0–40)
Albumin/Globulin Ratio: 2.4 — ABNORMAL HIGH (ref 1.2–2.2)
Albumin: 4.8 g/dL (ref 4.0–5.0)
Alkaline Phosphatase: 68 IU/L (ref 44–121)
BUN/Creatinine Ratio: 14 (ref 9–20)
BUN: 13 mg/dL (ref 6–24)
Bilirubin Total: 0.6 mg/dL (ref 0.0–1.2)
CO2: 26 mmol/L (ref 20–29)
Calcium: 10.6 mg/dL — ABNORMAL HIGH (ref 8.7–10.2)
Chloride: 101 mmol/L (ref 96–106)
Creatinine, Ser: 0.96 mg/dL (ref 0.76–1.27)
Globulin, Total: 2 g/dL (ref 1.5–4.5)
Glucose: 80 mg/dL (ref 70–99)
Potassium: 4.1 mmol/L (ref 3.5–5.2)
Sodium: 143 mmol/L (ref 134–144)
Total Protein: 6.8 g/dL (ref 6.0–8.5)
eGFR: 101 mL/min/{1.73_m2} (ref >59)

## 2022-02-18 ENCOUNTER — Ambulatory Visit: Payer: Self-pay

## 2022-02-18 ENCOUNTER — Encounter: Payer: Self-pay | Admitting: Family Medicine

## 2022-02-18 ENCOUNTER — Ambulatory Visit: Payer: BC Managed Care – PPO | Admitting: Family Medicine

## 2022-02-18 VITALS — BP 138/104 | HR 98 | Ht 66.0 in | Wt 210.0 lb

## 2022-02-18 DIAGNOSIS — M25512 Pain in left shoulder: Secondary | ICD-10-CM

## 2022-02-18 DIAGNOSIS — S46012D Strain of muscle(s) and tendon(s) of the rotator cuff of left shoulder, subsequent encounter: Secondary | ICD-10-CM | POA: Diagnosis not present

## 2022-02-18 DIAGNOSIS — M7712 Lateral epicondylitis, left elbow: Secondary | ICD-10-CM | POA: Diagnosis not present

## 2022-02-18 NOTE — Patient Instructions (Signed)
Elbow looks fantastic ?Talk to Dr. Ave Filter and see what you think ?Read about PRP ?Write me after you talk to Dr. Ave Filter ?

## 2022-02-18 NOTE — Addendum Note (Signed)
Addended by: Glori Luis on: 02/18/2022 09:57 AM ? ? Modules accepted: Orders ? ?

## 2022-02-18 NOTE — Assessment & Plan Note (Signed)
Ultrasound looks good at the moment.  Patient has made improvement.  Patient has been highly consistent with the home exercises and bracing and is majority of the reason why patient is improving.  Patient will follow-up with me again if any worsening symptoms ?

## 2022-02-18 NOTE — Assessment & Plan Note (Signed)
Patient has followed up with orthopedic surgery.  Discussing other treatment options.  He did have a steroid injection 2 weeks ago.  Patient noticed some improvement in range of motion but still has some tightness that seems to be more consistent with some of the labral changes noted.  Patient is going to continue to work on the home exercises and icing regimen.  Patient given a handout about PRP and will consider it.  Follow-up with me if he decides to do more conservative route or continue to follow-up with the orthopedic surgeon.  Total time reviewing patient's imaging, chart, outside records from orthopedic surgeon as well as discussing with patient greater than 35 minutes. ?

## 2022-02-21 HISTORY — PX: OTHER SURGICAL HISTORY: SHX169

## 2022-02-27 ENCOUNTER — Encounter: Payer: Self-pay | Admitting: Family Medicine

## 2022-02-27 DIAGNOSIS — M25512 Pain in left shoulder: Secondary | ICD-10-CM | POA: Diagnosis not present

## 2022-03-03 DIAGNOSIS — S46012A Strain of muscle(s) and tendon(s) of the rotator cuff of left shoulder, initial encounter: Secondary | ICD-10-CM | POA: Diagnosis not present

## 2022-03-03 DIAGNOSIS — S43492A Other sprain of left shoulder joint, initial encounter: Secondary | ICD-10-CM | POA: Diagnosis not present

## 2022-03-03 DIAGNOSIS — G8918 Other acute postprocedural pain: Secondary | ICD-10-CM | POA: Diagnosis not present

## 2022-03-03 DIAGNOSIS — Y999 Unspecified external cause status: Secondary | ICD-10-CM | POA: Diagnosis not present

## 2022-03-03 DIAGNOSIS — S43432A Superior glenoid labrum lesion of left shoulder, initial encounter: Secondary | ICD-10-CM | POA: Diagnosis not present

## 2022-03-03 DIAGNOSIS — X58XXXA Exposure to other specified factors, initial encounter: Secondary | ICD-10-CM | POA: Diagnosis not present

## 2022-03-03 DIAGNOSIS — M7542 Impingement syndrome of left shoulder: Secondary | ICD-10-CM | POA: Diagnosis not present

## 2022-03-03 DIAGNOSIS — M7552 Bursitis of left shoulder: Secondary | ICD-10-CM | POA: Diagnosis not present

## 2022-03-06 ENCOUNTER — Other Ambulatory Visit (INDEPENDENT_AMBULATORY_CARE_PROVIDER_SITE_OTHER): Payer: BC Managed Care – PPO

## 2022-03-06 ENCOUNTER — Other Ambulatory Visit: Payer: Self-pay | Admitting: Family Medicine

## 2022-03-07 LAB — PTH, INTACT AND CALCIUM
Calcium: 9.8 mg/dL (ref 8.7–10.2)
PTH: 16 pg/mL (ref 15–65)

## 2022-04-07 ENCOUNTER — Other Ambulatory Visit: Payer: Self-pay | Admitting: Family Medicine

## 2022-04-07 DIAGNOSIS — I10 Essential (primary) hypertension: Secondary | ICD-10-CM

## 2022-04-29 ENCOUNTER — Ambulatory Visit (INDEPENDENT_AMBULATORY_CARE_PROVIDER_SITE_OTHER): Payer: BC Managed Care – PPO | Admitting: Podiatry

## 2022-04-29 DIAGNOSIS — B351 Tinea unguium: Secondary | ICD-10-CM

## 2022-04-29 NOTE — Progress Notes (Signed)
  Subjective:  Patient ID: Shawn English, male    DOB: 02/14/1976,  MRN: [id number]  Chief Complaint  Patient presents with   Nail Problem    Thick painful toenails, 4 month follow up     46 y.o. male returns for follow-up with the above complaint. History confirmed with patient.  He has been using the Jublia and fluconazole weekly dosing he is noticing progress now  Objective:  Physical Exam: warm, good capillary refill, no trophic changes or ulcerative lesions, normal DP and PT pulses, normal sensory exam, and onychomycosis left hallux improving growing out proximal clearing noted on the medial side, there is superficial white onychomycosis of the right second toenail   .    Assessment:   1. Onychomycosis       Plan:  Patient was evaluated and treated and all questions answered.  Doing well and seeing improvement.  Continue fluconazole weekly dosing.  He will let me know when he is low on this.  His last lab work in April was within normal limits regarding his liver function.  He will also continue Jublia he has plenty of this currently and with his wellness  Return in about 6 months (around 10/29/2022) for follow up after nail fungus treatment.

## 2022-04-30 ENCOUNTER — Other Ambulatory Visit: Payer: Self-pay | Admitting: Family

## 2022-04-30 DIAGNOSIS — I10 Essential (primary) hypertension: Secondary | ICD-10-CM

## 2022-07-21 ENCOUNTER — Other Ambulatory Visit: Payer: Self-pay | Admitting: Family Medicine

## 2022-07-21 DIAGNOSIS — I10 Essential (primary) hypertension: Secondary | ICD-10-CM

## 2022-07-21 MED ORDER — HYDROCHLOROTHIAZIDE 25 MG PO TABS: 25.0000 mg | ORAL_TABLET | Freq: Every day | ORAL | 0 refills | Status: DC

## 2022-08-19 ENCOUNTER — Encounter: Payer: Self-pay | Admitting: Family Medicine

## 2022-08-19 ENCOUNTER — Ambulatory Visit (INDEPENDENT_AMBULATORY_CARE_PROVIDER_SITE_OTHER): Payer: BC Managed Care – PPO | Admitting: Family Medicine

## 2022-08-19 VITALS — BP 125/90 | HR 109 | Temp 98.8°F | Ht 66.0 in | Wt 215.0 lb

## 2022-08-19 DIAGNOSIS — Z23 Encounter for immunization: Secondary | ICD-10-CM | POA: Diagnosis not present

## 2022-08-19 DIAGNOSIS — Z0001 Encounter for general adult medical examination with abnormal findings: Secondary | ICD-10-CM

## 2022-08-19 DIAGNOSIS — E6609 Other obesity due to excess calories: Secondary | ICD-10-CM | POA: Diagnosis not present

## 2022-08-19 DIAGNOSIS — I10 Essential (primary) hypertension: Secondary | ICD-10-CM | POA: Diagnosis not present

## 2022-08-19 DIAGNOSIS — Z6834 Body mass index (BMI) 34.0-34.9, adult: Secondary | ICD-10-CM

## 2022-08-19 DIAGNOSIS — Z0000 Encounter for general adult medical examination without abnormal findings: Secondary | ICD-10-CM | POA: Insufficient documentation

## 2022-08-19 DIAGNOSIS — Z130 Encounter for screening for diseases of the blood and blood-forming organs and certain disorders involving the immune mechanism: Secondary | ICD-10-CM

## 2022-08-19 DIAGNOSIS — E7800 Pure hypercholesterolemia, unspecified: Secondary | ICD-10-CM

## 2022-08-19 DIAGNOSIS — Z1329 Encounter for screening for other suspected endocrine disorder: Secondary | ICD-10-CM

## 2022-08-19 NOTE — Assessment & Plan Note (Signed)
Physical exam completed.  I encouraged healthy diet and exercise.  Patient is already starting to work on this.  Discussed prostate cancer screening starting at age 44.  Discussed colon cancer screening starting at age 80.  He will be given a tetanus vaccine today.  We discussed the updated COVID vaccination and he will consider getting this at the pharmacy.  Lab work as outlined.

## 2022-08-19 NOTE — Progress Notes (Signed)
Tommi Rumps, MD Phone: [phone]  Shawn English is a 46 y.o. male who presents today for CPE.  Diet: diet has not been great recently with his injury issues in his left arm Exercise: just now getting back to the gym Colonoscopy: not indicated Prostate cancer screening: not indicated Family history-  Prostate cancer: no  Colon cancer: no Vaccines-   Flu: declines  Tetanus: due  COVID19: x2 HIV screening: UTD Hep C Screening: UTD Tobacco use: no Alcohol use: 4-2/AJGO Illicit Drug use: no Dentist: not recently Ophthalmology: yes Home BPs run 120s/80s.    Active Ambulatory Problems    Diagnosis Date Noted   Essential hypertension 03/21/2015   Multiple allergies 05/08/2015   Obesity 06/04/2016   History of anaphylaxis 08/24/2016   Cervical radiculopathy at C8 08/13/2017   Allergic rhinitis 01/19/2018   Fatty liver disease, nonalcoholic 11/57/2620   Elevated LDL cholesterol level 01/02/2020   Toenail deformity 08/18/2021   Left rotator cuff tear 12/08/2021   Lateral epicondylitis of left elbow 12/08/2021   Encounter for general adult medical examination with abnormal findings 08/19/2022   Resolved Ambulatory Problems    Diagnosis Date Noted   Encounter to establish care 03/21/2015   Neck pain, musculoskeletal 03/21/2015   Need for prophylactic vaccination and inoculation against influenza 07/31/2015   Skin sensitivity 12/31/2016   Past Medical History:  Diagnosis Date   Chicken pox    Hypertension     Family History  Problem Relation Age of Onset   Hypertension Father    Cancer Maternal Grandmother        breast    Social History   Socioeconomic History   Marital status: Married    Spouse name: Not on file   Number of children: Not on file   Years of education: Not on file   Highest education level: Not on file  Occupational History   Not on file  Tobacco Use   Smoking status: Former    Types: Cigarettes    Quit date: 09/09/2014    Years since  quitting: 7.9   Smokeless tobacco: Never  Substance and Sexual Activity   Alcohol use: Yes    Alcohol/week: 1.0 standard drink of alcohol    Types: 1 Standard drinks or equivalent per week    Comment: Occasional use    Drug use: No   Sexual activity: Yes    Partners: Female    Comment: Fiance  Other Topics Concern   Not on file  Social History Narrative   Lives with fiance    Child on the way, girl, due in July 2016    No Pets    Work at [employer]    Left handed   Caffeine- 2 cups of coffee, occasional soda    Enjoys playing guitar and traveling    Social Determinants of Radio broadcast assistant Strain: Not on file  Food Insecurity: Not on file  Transportation Needs: Not on file  Physical Activity: Not on file  Stress: Not on file  Social Connections: Not on file  Intimate Partner Violence: Not on file    ROS  General:  Negative for nexplained weight loss, fever Skin: Negative for new or changing mole, sore that won't heal HEENT: Negative for trouble hearing, trouble seeing, ringing in ears, mouth sores, hoarseness, change in voice, dysphagia. CV:  Negative for chest pain, dyspnea, edema, palpitations Resp: Negative for cough, dyspnea, hemoptysis GI: Negative for nausea, vomiting, diarrhea, constipation, abdominal pain,  melena, hematochezia. GU: Negative for dysuria, incontinence, urinary hesitance, hematuria, vaginal or penile discharge, polyuria, sexual difficulty, lumps in testicle or breasts MSK: Negative for muscle cramps or aches, joint pain or swelling Neuro: Negative for headaches, weakness, numbness, dizziness, passing out/fainting Psych: Negative for depression, anxiety, memory problems  Objective  Physical Exam Vitals:   08/19/22 0906  BP: (!) 125/90  Pulse: (!) 109  Temp: 98.8 F (37.1 C)  SpO2: 98%    BP Readings from Last 3 Encounters:  08/19/22 (!) 125/90  02/18/22 (!) 138/104  02/16/22 118/80   Wt Readings from  Last 3 Encounters:  08/19/22 215 lb (97.5 kg)  02/18/22 210 lb (95.3 kg)  02/16/22 211 lb (95.7 kg)    Physical Exam Constitutional:      General: He is not in acute distress.    Appearance: He is not diaphoretic.  HENT:     Head: Normocephalic and atraumatic.  Cardiovascular:     Rate and Rhythm: Normal rate and regular rhythm.     Heart sounds: Normal heart sounds.  Pulmonary:     Effort: Pulmonary effort is normal.     Breath sounds: Normal breath sounds.  Abdominal:     General: Bowel sounds are normal. There is no distension.     Palpations: Abdomen is soft.     Tenderness: There is no abdominal tenderness.  Musculoskeletal:     Right lower leg: No edema.     Left lower leg: No edema.  Lymphadenopathy:     Cervical: No cervical adenopathy.  Skin:    General: Skin is warm and dry.  Neurological:     Mental Status: He is alert.  Psychiatric:        Mood and Affect: Mood normal.      Assessment/Plan:   Problem List Items Addressed This Visit     Essential hypertension (Chronic)    At goal at home given ASCVD risk score of 1.7%. Discussed goal of less than 140/90. He will start checking it more frequently and let me know if it is not at goal.   He will continue HCTZ 25 mg once daily and losartan 100 mg daily.      Relevant Orders   Comp Met (CMET)   Obesity (Chronic)   Relevant Orders   HgB A1c   Elevated LDL cholesterol level   Relevant Orders   Comp Met (CMET)   Lipid panel   Encounter for general adult medical examination with abnormal findings - Primary    Physical exam completed.  I encouraged healthy diet and exercise.  Patient is already starting to work on this.  Discussed prostate cancer screening starting at age 70.  Discussed colon cancer screening starting at age 64.  He will be given a tetanus vaccine today.  We discussed the updated COVID vaccination and he will consider getting this at the pharmacy.  Lab work as outlined.      Other Visit  Diagnoses     Thyroid disorder screen       Relevant Orders   TSH   Screening for deficiency anemia       Relevant Orders   CBC      The 10-year ASCVD risk score (Arnett DK, et al., 2019) is: 1.7%*   Values used to calculate the score:     Age: 71 years     Sex: Male     Is Non-Hispanic African American: No     Diabetic: No  Tobacco smoker: No     Systolic Blood Pressure: 014 mmHg     Is BP treated: Yes     HDL Cholesterol: 44 mg/dL*     Total Cholesterol: 162 mg/dL*     * - Cholesterol units were assumed for this score calculation   Return in about 6 months (around 02/18/2023) for htn.   Tommi Rumps, MD [place]

## 2022-08-19 NOTE — Assessment & Plan Note (Addendum)
At goal at home given ASCVD risk score of 1.7%. Discussed goal of less than 140/90. He will start checking it more frequently and let me know if it is not at goal.   He will continue HCTZ 25 mg once daily and losartan 100 mg daily.

## 2022-08-20 LAB — COMPREHENSIVE METABOLIC PANEL
ALT: 79 IU/L — ABNORMAL HIGH (ref 0–44)
AST: 34 IU/L (ref 0–40)
Albumin/Globulin Ratio: 2.1 (ref 1.2–2.2)
Albumin: 5.1 g/dL (ref 4.1–5.1)
Alkaline Phosphatase: 80 IU/L (ref 44–121)
BUN/Creatinine Ratio: 13 (ref 9–20)
BUN: 13 mg/dL (ref 6–24)
Bilirubin Total: 0.4 mg/dL (ref 0.0–1.2)
CO2: 23 mmol/L (ref 20–29)
Calcium: 10.6 mg/dL — ABNORMAL HIGH (ref 8.7–10.2)
Chloride: 100 mmol/L (ref 96–106)
Creatinine, Ser: 0.98 mg/dL (ref 0.76–1.27)
Globulin, Total: 2.4 g/dL (ref 1.5–4.5)
Glucose: 90 mg/dL (ref 70–99)
Potassium: 4.2 mmol/L (ref 3.5–5.2)
Sodium: 142 mmol/L (ref 134–144)
Total Protein: 7.5 g/dL (ref 6.0–8.5)
eGFR: 98 mL/min/{1.73_m2} (ref >59)

## 2022-08-20 LAB — CBC
Hematocrit: 46.7 % (ref 37.5–51.0)
Hemoglobin: 16.1 g/dL (ref 13.0–17.7)
MCH: 30.1 pg (ref 26.6–33.0)
MCHC: 34.5 g/dL (ref 31.5–35.7)
MCV: 88 fL (ref 79–97)
Platelets: 293 10*3/uL (ref 150–450)
RBC: 5.34 x10E6/uL (ref 4.14–5.80)
RDW: 13.1 % (ref 11.6–15.4)
WBC: 6.3 10*3/uL (ref 3.4–10.8)

## 2022-08-20 LAB — HEMOGLOBIN A1C
Est. average glucose Bld gHb Est-mCnc: 97 mg/dL
Hgb A1c MFr Bld: 5 % (ref 4.8–5.6)

## 2022-08-20 LAB — LIPID PANEL
Chol/HDL Ratio: 5.2 ratio — ABNORMAL HIGH (ref 0.0–5.0)
Cholesterol, Total: 223 mg/dL — ABNORMAL HIGH (ref 100–199)
HDL: 43 mg/dL (ref >39)
LDL Chol Calc (NIH): 155 mg/dL — ABNORMAL HIGH (ref 0–99)
Triglycerides: 136 mg/dL (ref 0–149)
VLDL Cholesterol Cal: 25 mg/dL (ref 5–40)

## 2022-08-20 LAB — TSH: TSH: 1.51 u[IU]/mL (ref 0.450–4.500)

## 2022-08-24 ENCOUNTER — Other Ambulatory Visit: Payer: Self-pay | Admitting: Family Medicine

## 2022-08-24 DIAGNOSIS — R7989 Other specified abnormal findings of blood chemistry: Secondary | ICD-10-CM

## 2022-08-24 NOTE — Addendum Note (Signed)
Addended by: Leone Haven on: 08/24/2022 10:39 AM   Modules accepted: Orders

## 2022-09-03 IMAGING — MR MR SHOULDER*L* W/ CM
5 series · 40 of 40 positions shown · IV contrast (agent unspecified)
Comparison: X-ray 12/08/2021

CLINICAL DATA: Left shoulder pain for 3 months

EXAM:
MR ARTHROGRAM OF THE LEFT SHOULDER
TECHNIQUE: Multiplanar, multisequence MR imaging of the left shoulder was
performed following the administration of intra-articular contrast.
CONTRAST:  See Injection Documentation.

[Series 3: T1 fat-sat · axial · 4.0mm · 0.27mm/px · z∈[-26,+55]mm · 8 of 18 slices shown (1 of 3)]
[im 1/18]
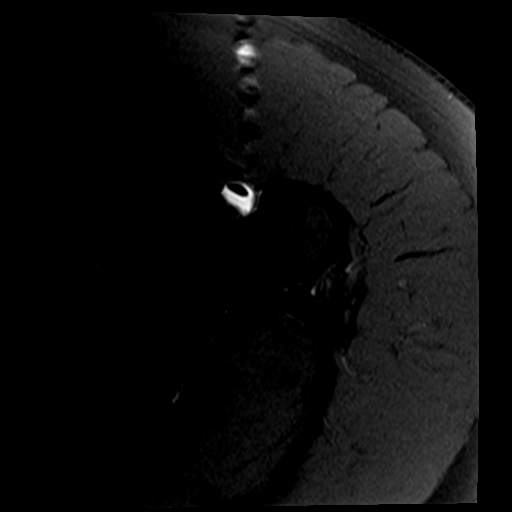
[im 3/18]
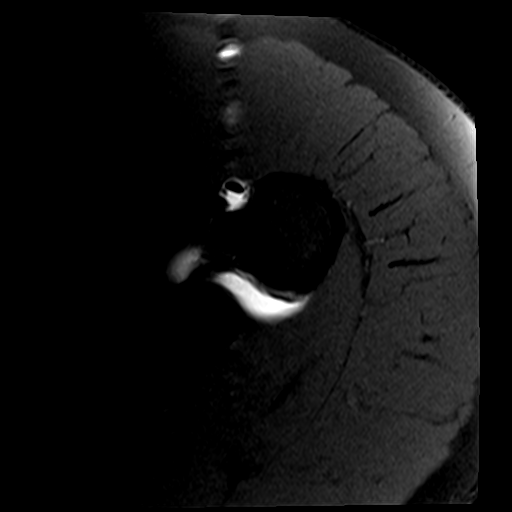
[im 5/18]
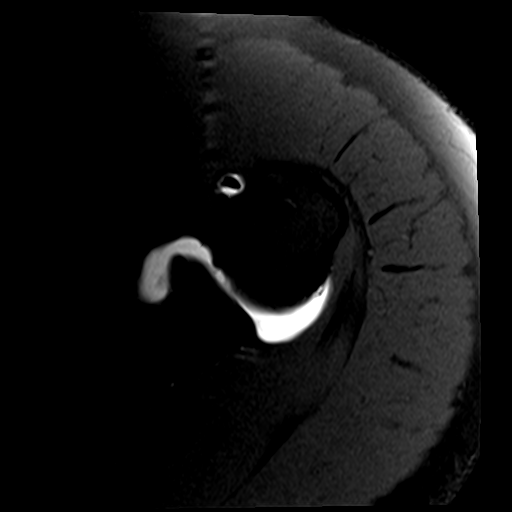
[im 8/18]
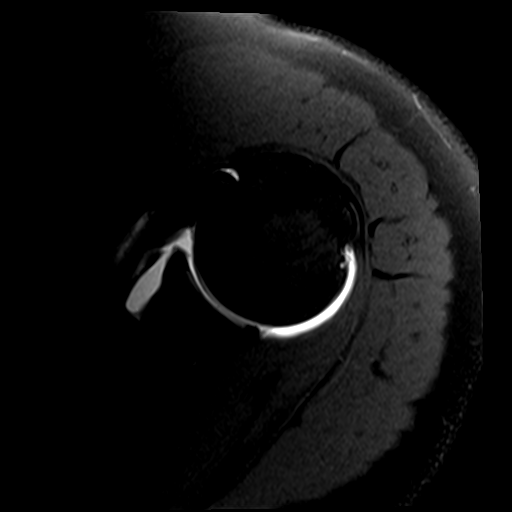
[im 10/18]
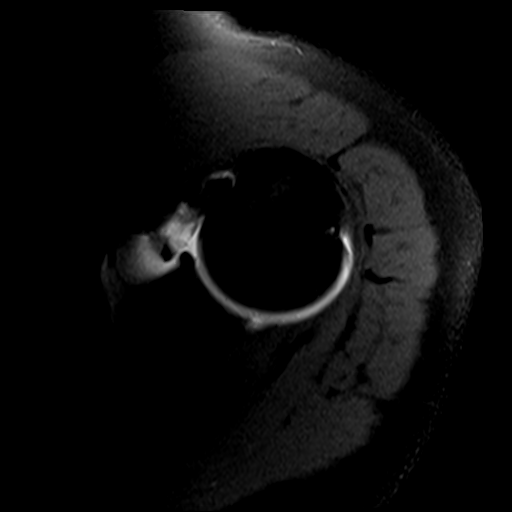
[im 13/18]
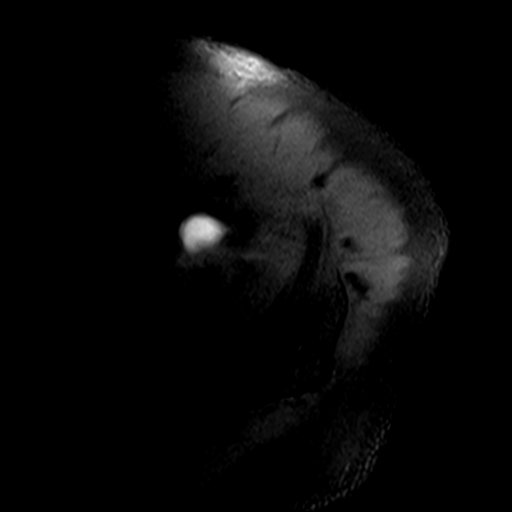
[im 15/18]
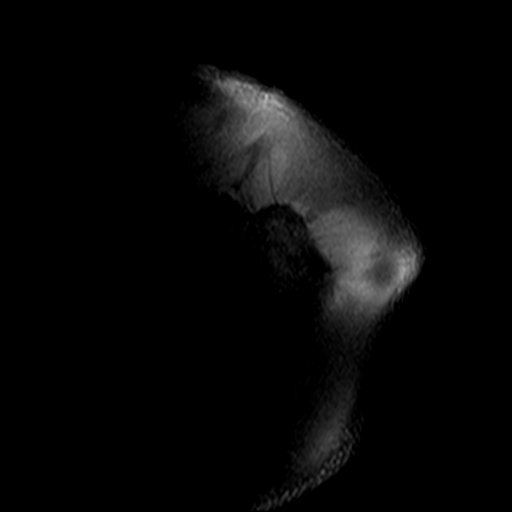
[im 18/18]
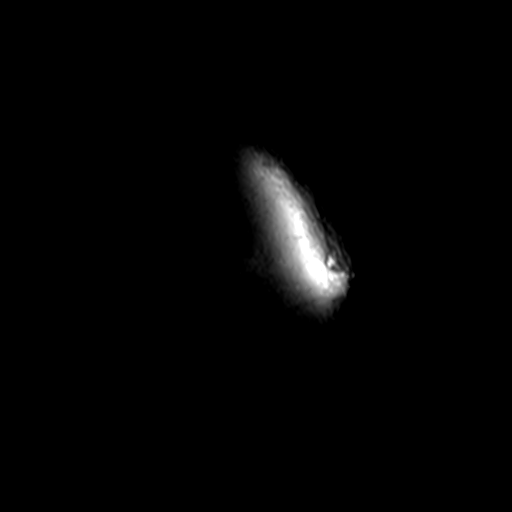

[Series 4: T2 fat-sat · coronal · 4.0mm · 0.55mm/px · 8 of 17 slices shown (1 of 2)]
[im 1/17]
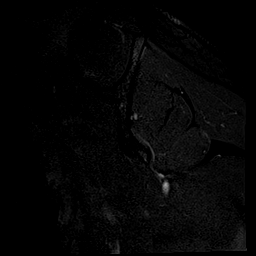
[im 3/17]
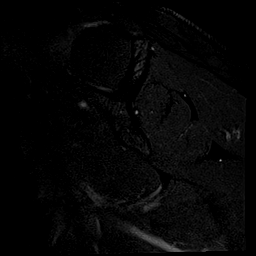
[im 5/17]
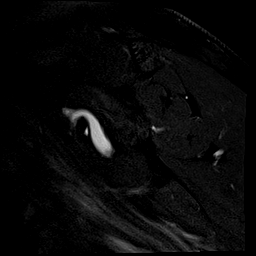
[im 7/17]
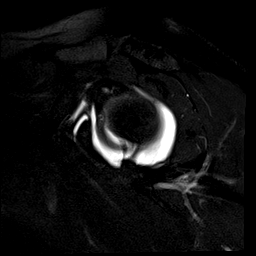
[im 10/17]
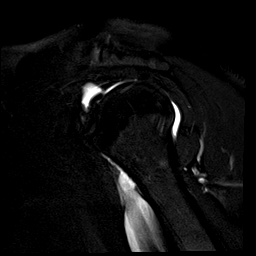
[im 12/17]
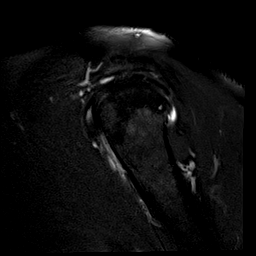
[im 14/17]
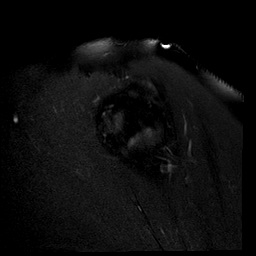
[im 17/17]
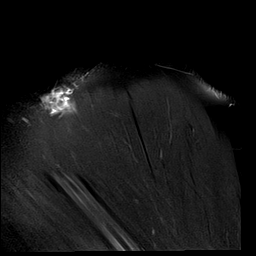

[Series 5: T1 fat-sat · sagittal · 4.0mm · 0.55mm/px · 8 of 16 slices shown (2 of 3)]
[im 1/16]
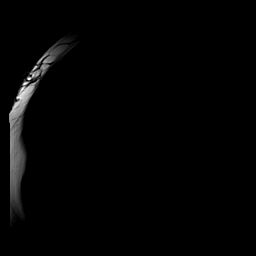
[im 3/16]
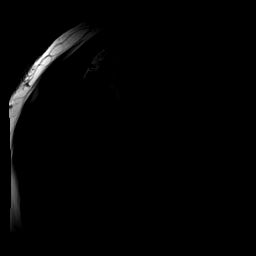
[im 5/16]
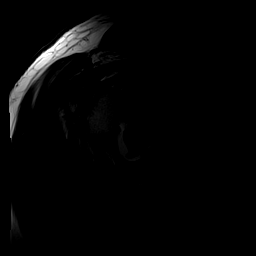
[im 7/16]
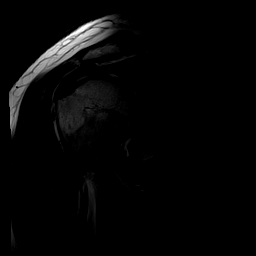
[im 9/16]
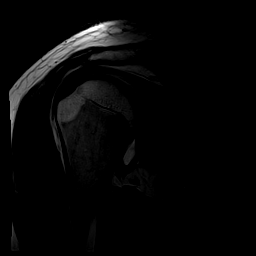
[im 11/16]
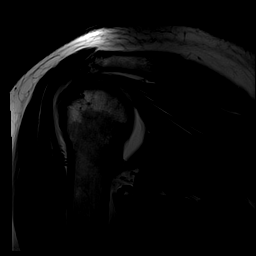
[im 13/16]
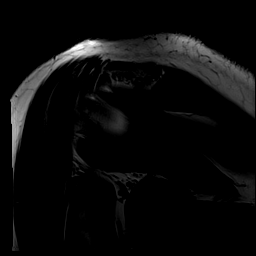
[im 16/16]
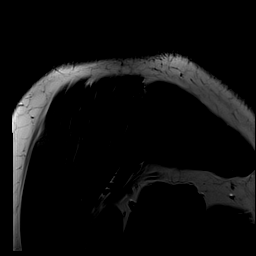

[Series 6: T1 fat-sat · sagittal · 4.0mm · 0.55mm/px · 8 of 16 slices shown (3 of 3)]
[im 1/16]
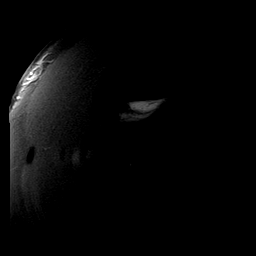
[im 3/16]
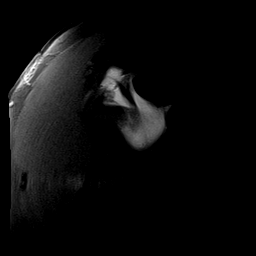
[im 5/16]
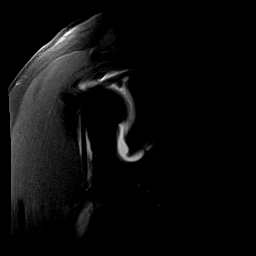
[im 7/16]
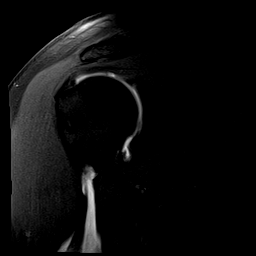
[im 9/16]
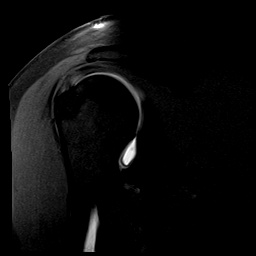
[im 11/16]
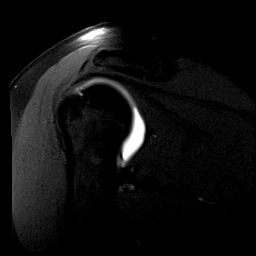
[im 13/16]
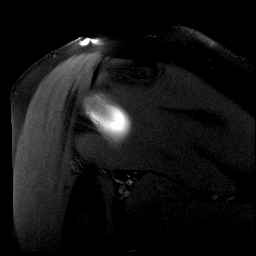
[im 16/16]
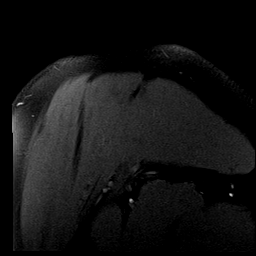

[Series 7: T2 fat-sat · sagittal · 4.0mm · 0.55mm/px · 8 of 16 slices shown (2 of 2)]
[im 1/16]
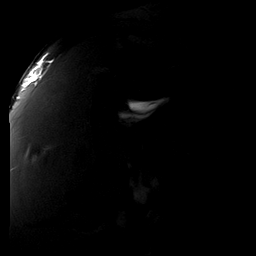
[im 3/16]
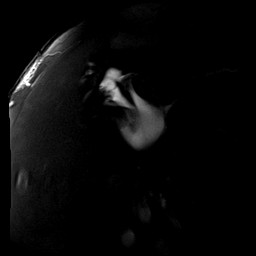
[im 5/16]
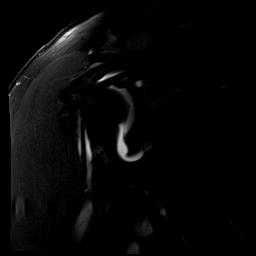
[im 7/16]
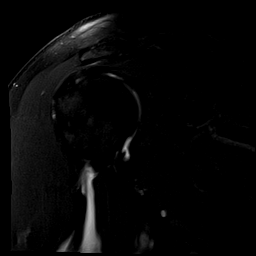
[im 9/16]
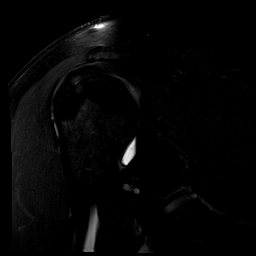
[im 11/16]
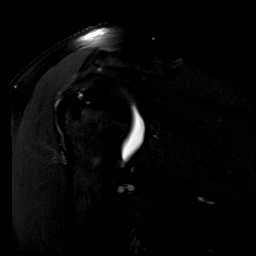
[im 13/16]
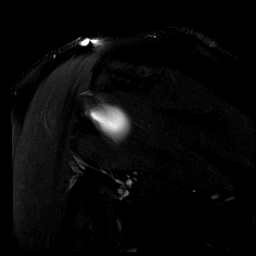
[im 16/16]
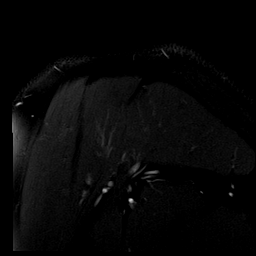

[40 of 40 positions shown; findings below may reference images not displayed]

FINDINGS: Rotator cuff: Mild tendinosis of the supraspinatus and infraspinatus
tendons with small low-grade rim rent tear along the anterior
supraspinatus tendon insertion. Subscapularis and teres minor
tendons within normal limits. No full-thickness or retracted rotator
cuff tear.

Muscles: Preserved bulk and signal intensity of the rotator cuff
musculature without edema, atrophy, or fatty infiltration.

Biceps long head:  Intact and appropriately positioned.

Acromioclavicular Joint: Mild degenerative changes of the AC joint.
Trace subacromial-subdeltoid bursal fluid. No contrast extends into
the bursal space.

Glenohumeral Joint: Adequately distended with injected contrast. No
cartilage defect.

Labrum: Irregular tear of the superior to posterosuperior labrum
(series 6, images 5-6). Remainder of the labrum appears intact.

Bones:No acute fracture. No dislocation. No bone marrow edema.No
suspicious marrow replacing bone lesion.

Other: None.
IMPRESSION: 1. Mild supraspinatus and infraspinatus tendinosis with small
low-grade rim rent tear along the anterior supraspinatus tendon
insertion. No full-thickness or retracted rotator cuff tear.
2. Irregular tear of the superior to posterosuperior labrum.
3. Mild degenerative changes of the AC joint.

## 2022-09-03 IMAGING — XA DG FLUORO GUIDE NDL PLC/BX
1 series · 1 of 1 positions shown · IV contrast (multihance)
Comparison: none

CLINICAL DATA: Left shoulder pain

EXAM:
LEFT SHOULDER INJECTION UNDER FLUOROSCOPY
TECHNIQUE: An appropriate skin entrance site was determined. The site was
marked, prepped with Betadine, draped in the usual sterile fashion,
and infiltrated locally with buffered Lidocaine. 20 gauge spinal
needle was advanced to the superomedial margin of the humeral head
under intermittent fluoroscopy. 1 ml of Lidocaine injected easily. A
mixture of 0.1 mL of Multihance 10 ml of Omnipaque 300 was then used
to opacify the left shoulder capsule. No immediate complication.
FLUOROSCOPY:
Radiation Exposure Index (as provided by the fluoroscopic device):
1.5 mGy Kerma

[Series 1: ortho standard · 1 of 1 slices shown]
[im 1/1]
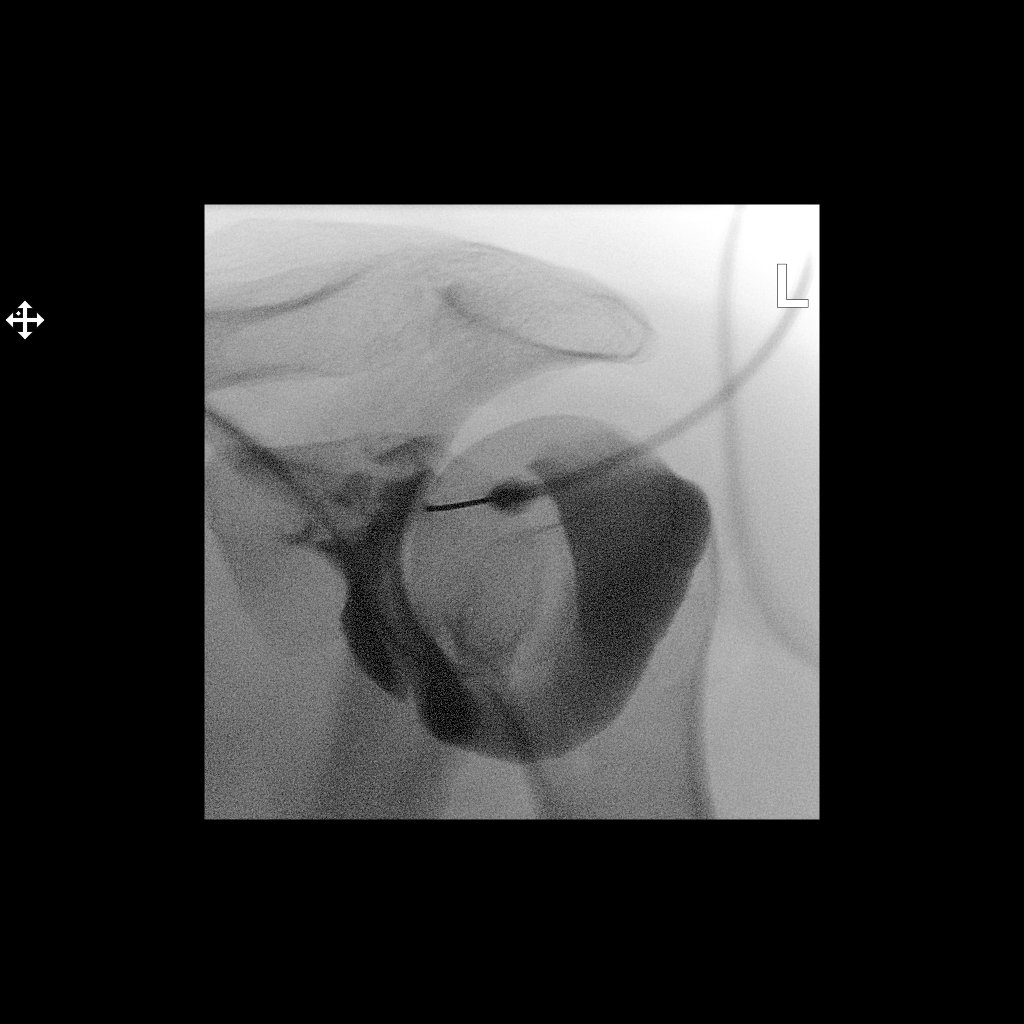

[1 of 1 positions shown; findings below may reference images not displayed]

FINDINGS: Contrast flowed freely within the left shoulder joint space. Please
see MRI arthrogram report for further details.
IMPRESSION: Technically successful left shoulder injection for MRI.

## 2022-09-08 DIAGNOSIS — R7989 Other specified abnormal findings of blood chemistry: Secondary | ICD-10-CM | POA: Diagnosis not present

## 2022-09-09 LAB — COMPREHENSIVE METABOLIC PANEL
ALT: 67 IU/L — ABNORMAL HIGH (ref 0–44)
AST: 41 IU/L — ABNORMAL HIGH (ref 0–40)
Albumin/Globulin Ratio: 2.5 — ABNORMAL HIGH (ref 1.2–2.2)
Albumin: 4.9 g/dL (ref 4.1–5.1)
Alkaline Phosphatase: 72 IU/L (ref 44–121)
BUN/Creatinine Ratio: 17 (ref 9–20)
BUN: 16 mg/dL (ref 6–24)
Bilirubin Total: 0.6 mg/dL (ref 0.0–1.2)
CO2: 25 mmol/L (ref 20–29)
Calcium: 9.8 mg/dL (ref 8.7–10.2)
Chloride: 100 mmol/L (ref 96–106)
Creatinine, Ser: 0.95 mg/dL (ref 0.76–1.27)
Globulin, Total: 2 g/dL (ref 1.5–4.5)
Glucose: 94 mg/dL (ref 70–99)
Potassium: 3.8 mmol/L (ref 3.5–5.2)
Sodium: 142 mmol/L (ref 134–144)
Total Protein: 6.9 g/dL (ref 6.0–8.5)
eGFR: 101 mL/min/{1.73_m2} (ref >59)

## 2022-09-14 ENCOUNTER — Other Ambulatory Visit: Payer: Self-pay | Admitting: Family Medicine

## 2022-09-14 DIAGNOSIS — R7989 Other specified abnormal findings of blood chemistry: Secondary | ICD-10-CM

## 2022-09-25 ENCOUNTER — Other Ambulatory Visit: Payer: BC Managed Care – PPO

## 2022-10-08 ENCOUNTER — Other Ambulatory Visit (INDEPENDENT_AMBULATORY_CARE_PROVIDER_SITE_OTHER): Payer: BC Managed Care – PPO

## 2022-10-08 DIAGNOSIS — R7989 Other specified abnormal findings of blood chemistry: Secondary | ICD-10-CM

## 2022-10-08 NOTE — Addendum Note (Signed)
Addended by: Warden Fillers on: 10/08/2022 09:11 AM   Modules accepted: Orders

## 2022-10-09 LAB — HEPATIC FUNCTION PANEL
ALT: 72 IU/L — ABNORMAL HIGH (ref 0–44)
AST: 36 IU/L (ref 0–40)
Albumin: 4.9 g/dL (ref 4.1–5.1)
Alkaline Phosphatase: 74 IU/L (ref 44–121)
Bilirubin Total: 0.5 mg/dL (ref 0.0–1.2)
Bilirubin, Direct: 0.15 mg/dL (ref 0.00–0.40)
Total Protein: 7 g/dL (ref 6.0–8.5)

## 2022-10-17 ENCOUNTER — Other Ambulatory Visit: Payer: Self-pay | Admitting: Family Medicine

## 2022-10-17 DIAGNOSIS — I10 Essential (primary) hypertension: Secondary | ICD-10-CM

## 2022-10-27 ENCOUNTER — Encounter: Payer: Self-pay | Admitting: Family Medicine

## 2022-10-27 ENCOUNTER — Ambulatory Visit: Payer: BC Managed Care – PPO | Admitting: Family Medicine

## 2022-10-27 VITALS — BP 130/70 | HR 107 | Temp 99.1°F | Ht 66.0 in | Wt 218.0 lb

## 2022-10-27 DIAGNOSIS — K219 Gastro-esophageal reflux disease without esophagitis: Secondary | ICD-10-CM

## 2022-10-27 DIAGNOSIS — M79631 Pain in right forearm: Secondary | ICD-10-CM | POA: Diagnosis not present

## 2022-10-27 NOTE — Assessment & Plan Note (Signed)
Patient with rare symptoms related to reflux.  Discussed testing for H. pylori.  Discussed use of Tums as needed given infrequent episodes of reflux.  Discussed he could also try a 2-week course of Nexium or Prilosec over-the-counter and see if that reduced his symptom frequency.  Advised he needs to try to lose weight as that would likely help with his reflux.

## 2022-10-27 NOTE — Progress Notes (Signed)
Marikay Alar, MD Phone: [phone]  Shawn English is a 46 y.o. male who presents today for same day visit.   GERD:   Reflux symptoms: once every 3-4 months he gets burning in his chest   Abd pain: no   Blood in stool: no  Dysphagia: no   EGD: no  Medication: tums prn Notes reflux issues going back to high school. Has been pretty stable over the years. No family history of esophageal or stomach cancer. No weight loss. One cup of coffee daily. No specific food triggers.   Right forearm pain: Patient notes last night he reached into his freezer and felt a pop in his proximal right forearm near his antecubital fossa.  He has had some discomfort and possible deformity in that area.  He does have a sports medicine physician.   Social History   Tobacco Use  Smoking Status Former   Types: Cigarettes   Quit date: 09/09/2014   Years since quitting: 8.1  Smokeless Tobacco Never    Current Outpatient Medications on File Prior to Visit  Medication Sig Dispense Refill   hydrochlorothiazide (HYDRODIURIL) 25 MG tablet TAKE 1 TABLET(25 MG) BY MOUTH DAILY 90 tablet 0   losartan (COZAAR) 100 MG tablet TAKE 1 TABLET(100 MG) BY MOUTH DAILY 90 tablet 1   EPINEPHrine 0.3 mg/0.3 mL IJ SOAJ injection Inject 0.3 mLs (0.3 mg total) into the muscle as needed. (Patient not taking: Reported on 10/27/2022) 2 each 0   No current facility-administered medications on file prior to visit.     ROS see history of present illness  Objective  Physical Exam Vitals:   10/27/22 0817  BP: 130/70  Pulse: (!) 107  Temp: 99.1 F (37.3 C)  SpO2: 98%    BP Readings from Last 3 Encounters:  10/27/22 130/70  08/19/22 (!) 125/90  02/18/22 (!) 138/104   Wt Readings from Last 3 Encounters:  10/27/22 218 lb (98.9 kg)  08/19/22 215 lb (97.5 kg)  02/18/22 210 lb (95.3 kg)    Physical Exam Abdominal:     General: Bowel sounds are normal. There is no distension.     Palpations: Abdomen is soft.      Tenderness: There is no abdominal tenderness.  Musculoskeletal:     Comments: Tenderness over proximal right forearm near the antecubital fossa, no skin changes, possible mild swelling  Neurological:     Comments: 5/5 grip, bicep, tricep strength right arm, sensation to light touch intact right hand and forearm, right hand is warm, 2+ right radial pulse      Assessment/Plan: Please see individual problem list.  Gastroesophageal reflux disease, unspecified whether esophagitis present Assessment & Plan: Patient with rare symptoms related to reflux.  Discussed testing for H. pylori.  Discussed use of Tums as needed given infrequent episodes of reflux.  Discussed he could also try a 2-week course of Nexium or Prilosec over-the-counter and see if that reduced his symptom frequency.  Advised he needs to try to lose weight as that would likely help with his reflux.  Orders: -     H. pylori breath test  Right forearm pain Assessment & Plan: Patient with possible muscular tear, tendon tear, or ligament tear.  Discussed the need to see a sports medicine physician to have ultrasound to further evaluate this issue.  He will contact sports medicine today for an appointment.     Return if symptoms worsen or fail to improve.   Marikay Alar, MD [facility]

## 2022-10-27 NOTE — Assessment & Plan Note (Signed)
Patient with possible muscular tear, tendon tear, or ligament tear.  Discussed the need to see a sports medicine physician to have ultrasound to further evaluate this issue.  He will contact sports medicine today for an appointment.

## 2022-10-27 NOTE — Patient Instructions (Signed)
Nice to see you. We will contact you with your H. pylori test result. If you have any worsening reflux at any point please let me know. Please make sure you contact sports medicine for an appointment for your arm.

## 2022-10-27 NOTE — Progress Notes (Unsigned)
[facility] [address] [place] [address] Phone: [phone] Subjective:    I'm seeing this patient by the request  of:  Glori Luis, MD  CC: right elbow pain   VPX:[id number]  Shawn English is a 46 y.o. male coming in with complaint of right elbow pain. Patient felt a pop in his elbow on 12/18. Patient having consistent pain in right arm, hurts worse when bending elbow and turning forearm. Patient was reaching into his deep freezer when the pop happened and immediatly pulled his arm back because of the pain. Has his elbow wrapped to keep compressed. Patient used ice on an off the night that it happened, also notes slight swelling. Patient is hold arm in a 90 degree bend.        Past Medical History:  Diagnosis Date   Chicken pox    Hypertension    Past Surgical History:  Procedure Laterality Date   CYST REMOVAL HAND  2009   Benign cyst removed from right arm   Social History   Socioeconomic History   Marital status: Married    Spouse name: Not on file   Number of children: Not on file   Years of education: Not on file   Highest education level: Not on file  Occupational History   Not on file  Tobacco Use   Smoking status: Former    Types: Cigarettes    Quit date: 09/09/2014    Years since quitting: 8.1   Smokeless tobacco: Never  Substance and Sexual Activity   Alcohol use: Yes    Alcohol/week: 1.0 standard drink of alcohol    Types: 1 Standard drinks or equivalent per week    Comment: Occasional use    Drug use: No   Sexual activity: Yes    Partners: Female    Comment: Fiance  Other Topics Concern   Not on file  Social History Narrative   Lives with fiance    Child on the way, girl, due in July 2016    No Pets    Work at [employer]    Left handed   Caffeine- 2 cups of coffee, occasional soda    Enjoys playing guitar and traveling    Social Determinants of Corporate investment banker  Strain: Not on file  Food Insecurity: Not on file  Transportation Needs: Not on file  Physical Activity: Not on file  Stress: Not on file  Social Connections: Not on file   Allergies  Allergen Reactions   Bee Venom Swelling   Fish-Derived Products Anaphylaxis    Fish oil included   Penicillins Anaphylaxis   Sulfa Antibiotics Nausea And Vomiting   Family History  Problem Relation Age of Onset   Hypertension Father    Cancer Maternal Grandmother        breast     Current Outpatient Medications (Cardiovascular):    hydrochlorothiazide (HYDRODIURIL) 25 MG tablet, TAKE 1 TABLET(25 MG) BY MOUTH DAILY   losartan (COZAAR) 100 MG tablet, TAKE 1 TABLET(100 MG) BY MOUTH DAILY   EPINEPHrine 0.3 mg/0.3 mL IJ SOAJ injection, Inject 0.3 mLs (0.3 mg total) into the muscle as needed. (Patient not taking: Reported on 10/27/2022)       Reviewed prior external information including notes and imaging from  primary care provider As well as notes that were available from care everywhere and other healthcare systems.  Past medical history, social, surgical and family history all  reviewed in electronic medical record.  No pertanent information unless stated regarding to the chief complaint.   Review of Systems:  No headache, visual changes, nausea, vomiting, diarrhea, constipation, dizziness, abdominal pain, skin rash, fevers, chills, night sweats, weight loss, swollen lymph nodes, body aches, joint swelling, chest pain, shortness of breath, mood changes. POSITIVE muscle aches, muscle swelling in the right arm  Objective  Blood pressure (!) 130/90, pulse (!) 107, height 5\' 6"  (1.676 m), weight 215 lb (97.5 kg), SpO2 99 %.   General: No apparent distress alert and oriented x3 mood and affect normal, dressed appropriately.  HEENT: Pupils equal, extraocular movements intact  Respiratory: Patient's speak in full sentences and does not appear short of breath  Cardiovascular: No lower extremity  edema, non tender, no erythema  Patient's forearm is swollen compared to the contralateral side.  Tender to palpation more on the antecubital fossa area.  Swelling noted over the distal bicep tendon.  Good grip strength, neurovascularly intact distally.  Limited muscular skeletal ultrasound was performed and interpreted by , M  Elbow exam shows patient does have some hypoechoic changes noted at the distal bicep tendon that seems to be mostly superficial and partial tearing.  Hyperechoic changes distal to the wrist that does look like there was some potential bleeding noted.  Patient's blood vessels are completely compressible at this time.    Impression and Recommendations:    The above documentation has been reviewed and is accurate and complete Antoine Primas, DO

## 2022-10-28 ENCOUNTER — Ambulatory Visit: Payer: Self-pay

## 2022-10-28 ENCOUNTER — Ambulatory Visit (INDEPENDENT_AMBULATORY_CARE_PROVIDER_SITE_OTHER): Payer: BC Managed Care – PPO

## 2022-10-28 ENCOUNTER — Ambulatory Visit: Payer: BC Managed Care – PPO | Admitting: Podiatry

## 2022-10-28 ENCOUNTER — Ambulatory Visit: Payer: BC Managed Care – PPO | Admitting: Family Medicine

## 2022-10-28 VITALS — BP 130/90 | HR 107 | Ht 66.0 in | Wt 215.0 lb

## 2022-10-28 DIAGNOSIS — M25521 Pain in right elbow: Secondary | ICD-10-CM

## 2022-10-28 DIAGNOSIS — S46211A Strain of muscle, fascia and tendon of other parts of biceps, right arm, initial encounter: Secondary | ICD-10-CM | POA: Diagnosis not present

## 2022-10-28 NOTE — Patient Instructions (Addendum)
Good to see you Get x ray on the way out Compression sleeve for the elbow daily  Restart nitroglycerin Ice 20 minutes every four hours If you change your mind on the pain let us know See me on January 5th

## 2022-10-28 NOTE — Assessment & Plan Note (Addendum)
Patient does have a partial tear noted.  Discussed sling, compression, which activities to do and which ones to avoid.  We discussed the nitroglycerin patches which patient has done previously for a partial tear.  Patient will start doing the nitroglycerin patches and follow-up with me again in 2 to 3 weeks and hopefully increase range of motion exercises.  Did discuss with patient that to seek medical attention if patient does have any worsening swelling.

## 2022-11-10 NOTE — Progress Notes (Unsigned)
[facility] [place] Phone: [phone] Subjective:   Shawn English, am serving as a scribe for Dr. Hulan Saas.  I'm seeing this patient by the request  of:  Leone Haven, MD  CC: Right elbow pain  [id number]  12/202023 Patient does have a partial tear noted.  Discussed sling, compression, which activities to do and which ones to avoid.  We discussed the nitroglycerin patches which patient has done previously for a partial tear.  Patient will start doing the nitroglycerin patches and follow-up with me again in 2 to 3 weeks and hopefully increase range of motion exercises.  Did discuss with patient that to seek medical attention if patient does have any worsening swelling.     Update 11/13/2022 Shawn English is a 47 y.o. male coming in with complaint of R elbow pain. Patient states that the elbow is still sore, still hurts and still throbs like a toothache. States that it is better than it was last week, he is able to handle the pain where before it was unbearable. The compression sleeve has helped.       Past Medical History:  Diagnosis Date   Chicken pox    Hypertension    Past Surgical History:  Procedure Laterality Date   CYST REMOVAL HAND  2009   Benign cyst removed from right arm   Social History   Socioeconomic History   Marital status: Married    Spouse name: Not on file   Number of children: Not on file   Years of education: Not on file   Highest education level: Not on file  Occupational History   Not on file  Tobacco Use   Smoking status: Former    Types: Cigarettes    Quit date: 09/09/2014    Years since quitting: 8.1   Smokeless tobacco: Never  Substance and Sexual Activity   Alcohol use: Yes    Alcohol/week: 1.0 standard drink of alcohol    Types: 1 Standard drinks or equivalent per week    Comment: Occasional use    Drug use: No   Sexual activity: Yes    Partners: Female     Comment: Fiance  Other Topics Concern   Not on file  Social History Narrative   Lives with fiance    Child on the way, girl, due in July 2016    No Pets    Work at [employer]    Left handed   Caffeine- 2 cups of coffee, occasional soda    Enjoys playing guitar and traveling    Social Determinants of Radio broadcast assistant Strain: Not on file  Food Insecurity: Not on file  Transportation Needs: Not on file  Physical Activity: Not on file  Stress: Not on file  Social Connections: Not on file   Allergies  Allergen Reactions   Bee Venom Swelling   Fish-Derived Products Anaphylaxis    Fish oil included   Penicillins Anaphylaxis   Sulfa Antibiotics Nausea And Vomiting   Family History  Problem Relation Age of Onset   Hypertension Father    Cancer Maternal Grandmother        breast     Current Outpatient Medications (Cardiovascular):    hydrochlorothiazide (HYDRODIURIL) 25 MG tablet, TAKE 1 TABLET(25 MG) BY MOUTH DAILY   losartan (COZAAR) 100 MG tablet, TAKE 1 TABLET(100 MG) BY MOUTH DAILY   EPINEPHrine 0.3 mg/0.3 mL IJ SOAJ  injection, Inject 0.3 mLs (0.3 mg total) into the muscle as needed. (Patient not taking: Reported on 10/27/2022)       Reviewed prior external information including notes and imaging from  primary care provider As well as notes that were available from care everywhere and other healthcare systems.  Past medical history, social, surgical and family history all reviewed in electronic medical record.  No pertanent information unless stated regarding to the chief complaint.   Review of Systems:  No headache, visual changes, nausea, vomiting, diarrhea, constipation, dizziness, abdominal pain, skin rash, fevers, chills, night sweats, weight loss, swollen lymph nodes, body aches, joint swelling, chest pain, shortness of breath, mood changes. POSITIVE muscle aches  Objective  Blood pressure 130/82, pulse 91, height 5\' 6"  (1.676  m), weight 215 lb (97.5 kg), SpO2 97 %.   General: No apparent distress alert and oriented x3 mood and affect normal, dressed appropriately.  HEENT: Pupils equal, extraocular movements intact  Respiratory: Patient's speak in full sentences and does not appear short of breath  Cardiovascular: No lower extremity edema, non tender, no erythema  Right elbow exam shows the patient does have still some mild tenderness near the insertion of the bicep tendon.  Patient has no significant swelling or bruising noted.  Good grip strength noted.  Good range of motion of the shoulder  Limited muscular skeletal ultrasound was performed and interpreted by Hulan Saas, M  Limited ultrasound shows the patient does still have some hyperechoic changes significant with some soft tissue injury now though increasing Doppler flow which is new.  Still with no significant retraction noted.  No avulsion noted at its insertion. Impression: Continued partial tearing of the bicep tendon distally but interval improvement    Impression and Recommendations:

## 2022-11-11 ENCOUNTER — Ambulatory Visit: Payer: BC Managed Care – PPO | Admitting: Podiatry

## 2022-11-13 ENCOUNTER — Ambulatory Visit: Payer: Self-pay

## 2022-11-13 ENCOUNTER — Ambulatory Visit: Payer: BC Managed Care – PPO | Admitting: Family Medicine

## 2022-11-13 VITALS — BP 130/82 | HR 91 | Ht 66.0 in | Wt 215.0 lb

## 2022-11-13 DIAGNOSIS — S46211D Strain of muscle, fascia and tendon of other parts of biceps, right arm, subsequent encounter: Secondary | ICD-10-CM | POA: Diagnosis not present

## 2022-11-13 DIAGNOSIS — M25521 Pain in right elbow: Secondary | ICD-10-CM

## 2022-11-13 NOTE — Patient Instructions (Signed)
[place] tool 1lb soup can during tv See me in 4-5 weeks to start exercises

## 2022-11-15 ENCOUNTER — Encounter: Payer: Self-pay | Admitting: Family Medicine

## 2022-11-15 NOTE — Assessment & Plan Note (Addendum)
Patient is making improvements and it does seem to have some interval improvement of the tendon.  Patient does have some improvement in range of motion noted today. Compression, icing still discussed.  Patient states that for pain medication he is doing well with over-the-counter at the moment.  Discussed worsening pain or weakness to come back sooner otherwise follow-up again in 6 weeks after doing home exercises regularly.  Continue the nitroglycerin patches with patient is not having any side effects.

## 2022-11-18 ENCOUNTER — Ambulatory Visit: Payer: BC Managed Care – PPO | Admitting: Family Medicine

## 2022-12-07 ENCOUNTER — Ambulatory Visit: Payer: BC Managed Care – PPO | Admitting: Podiatry

## 2022-12-07 DIAGNOSIS — B351 Tinea unguium: Secondary | ICD-10-CM

## 2022-12-07 NOTE — Progress Notes (Signed)
  Subjective:  Patient ID: Shawn English, male    DOB: [birth year],  MRN: [id number]  Chief Complaint  Patient presents with   Nail Problem    6 month Follow up on nail fungus    47 y.o. male returns for follow-up with the above complaint. History confirmed with patient.  Doing well with Jublia has had complete clearance he is pleased with progress  Objective:  Physical Exam: warm, good capillary refill, no trophic changes or ulcerative lesions, normal DP and PT pulses, normal sensory exam, and onychomycosis has resolved with present clinical clearing    Assessment:   1. Onychomycosis       Plan:  Patient was evaluated and treated and all questions answered.  Responded very well to topical treatment with efinaconazole.  Should be able to discontinue allow the nail to grow normally advised on possibility and symptoms of recurrence.  He will let me know if this develops or he is to return as needed  Return if symptoms worsen or fail to improve.

## 2022-12-23 NOTE — Progress Notes (Unsigned)
Shawn English Shawn English [address] [place] Phone: [phone] Subjective:   IVilma English, am serving as a scribe for Dr. Hulan Saas.  I'm seeing this patient by the request  of:  Shawn Haven, MD  CC: Right arm pain follow-up  [id number]  11/13/2022 Patient is making improvements and it does seem to have some interval improvement of the tendon.  Patient does have some improvement in range of motion noted today. Compression, icing still discussed.  Patient states that for pain medication he is doing well with over-the-counter at the moment.  Discussed worsening pain or weakness to come back sooner otherwise follow-up again in 6 weeks after doing home exercises regularly.  Continue the nitroglycerin patches with patient is not having any side effects.      Update 12/24/2022 Shawn English is a 47 y.o. male coming in with complaint of R elbow pain. Patient states has been alternating compression sleeve and nitro patches. The continual soreness is starting to dissipate. No new concerns.  Patient feels like he is approximately 60 to 70% better.     Past Medical History:  Diagnosis Date   Chicken pox    Hypertension    Past Surgical History:  Procedure Laterality Date   CYST REMOVAL HAND  2009   Benign cyst removed from right arm   Social History   Socioeconomic History   Marital status: Married    Spouse name: Not on file   Number of children: Not on file   Years of education: Not on file   Highest education level: Not on file  Occupational History   Not on file  Tobacco Use   Smoking status: Former    Types: Cigarettes    Quit date: 09/09/2014    Years since quitting: 8.2   Smokeless tobacco: Never  Substance and Sexual Activity   Alcohol use: Yes    Alcohol/week: 1.0 standard drink of alcohol    Types: 1 Standard drinks or equivalent per week    Comment: Occasional use    Drug use: No   Sexual activity: Yes     Partners: Female    Comment: Fiance  Other Topics Concern   Not on file  Social History Narrative   Lives with fiance    Child on the way, girl, due in July 2016    No Pets    Work at [employer]    Left handed   Caffeine- 2 cups of coffee, occasional soda    Enjoys playing guitar and traveling    Social Determinants of Radio broadcast assistant Strain: Not on file  Food Insecurity: Not on file  Transportation Needs: Not on file  Physical Activity: Not on file  Stress: Not on file  Social Connections: Not on file   Allergies  Allergen Reactions   Bee Venom Swelling   Fish-Derived Products Anaphylaxis    Fish oil included   Penicillins Anaphylaxis   Sulfa Antibiotics Nausea And Vomiting   Family History  Problem Relation Age of Onset   Hypertension Father    Cancer Maternal Grandmother        breast     Current Outpatient Medications (Cardiovascular):    EPINEPHrine 0.3 mg/0.3 mL IJ SOAJ injection, Inject 0.3 mLs (0.3 mg total) into the muscle as needed. (Patient not taking: Reported on 10/27/2022)   hydrochlorothiazide (HYDRODIURIL) 25 MG tablet, TAKE 1 TABLET(25 MG) BY MOUTH DAILY   losartan (  COZAAR) 100 MG tablet, TAKE 1 TABLET(100 MG) BY MOUTH DAILY         Objective  Blood pressure (!) 152/98, pulse (!) 117, height 5' 6"$  (1.676 m), weight 217 lb (98.4 kg), SpO2 97 %.   General: No apparent distress alert and oriented x3 mood and affect normal, dressed appropriately.  HEENT: Pupils equal, extraocular movements intact  Respiratory: Patient's speak in full sentences and does not appear short of breath  Cardiovascular: No lower extremity edema, non tender, no erythema  Significant improvement in the inspection of the medial aspect of the elbow.  No significant swelling noted.  Full range of motion.  Still tender with resisted flexion though noted at the muscular tendon juncture of the distal bicep  Limited muscular skeletal ultrasound  was performed and interpreted by Hulan Saas, M  Limited ultrasound shows the patient does have hypoechoic changes that is significant for more scar tissue formation in the muscular tendon junction.  Patient does have some likely dehydration noted.  No cortical defect noted. Impression: Interval healing noted of the musculotendinous junction of the bicep tendon distally    Impression and Recommendations:     The above documentation has been reviewed and is accurate and complete Lyndal Pulley, DO

## 2022-12-24 ENCOUNTER — Ambulatory Visit: Payer: BC Managed Care – PPO | Admitting: Family Medicine

## 2022-12-24 ENCOUNTER — Encounter: Payer: Self-pay | Admitting: Family Medicine

## 2022-12-24 ENCOUNTER — Ambulatory Visit: Payer: Self-pay

## 2022-12-24 VITALS — BP 152/98 | HR 117 | Ht 66.0 in | Wt 217.0 lb

## 2022-12-24 DIAGNOSIS — M25521 Pain in right elbow: Secondary | ICD-10-CM

## 2022-12-24 NOTE — Patient Instructions (Signed)
Remember to stay hydrated Compression sleeve when working out Start with 25% weight and increase by 10%  See you again in 2 months

## 2022-12-24 NOTE — Assessment & Plan Note (Signed)
Significant improvement noted at this time.  Scar tissue formation noted on the ultrasound.  Discussed compression slowly getting patient back to lifting mechanics.  Discussed continued compression with activity.  No icing regimen, patient on ultrasound did have some hydration issues and we did discuss about that as well.  Follow-up again in 6 to 8 weeks

## 2023-01-25 ENCOUNTER — Other Ambulatory Visit: Payer: Self-pay | Admitting: Family Medicine

## 2023-01-25 DIAGNOSIS — I10 Essential (primary) hypertension: Secondary | ICD-10-CM

## 2023-01-26 MED ORDER — HYDROCHLOROTHIAZIDE 25 MG PO TABS: 25.0000 mg | ORAL_TABLET | Freq: Every day | ORAL | 0 refills | Status: DC

## 2023-02-19 ENCOUNTER — Encounter: Payer: Self-pay | Admitting: Family Medicine

## 2023-02-19 ENCOUNTER — Ambulatory Visit: Payer: BC Managed Care – PPO | Admitting: Family Medicine

## 2023-02-19 VITALS — BP 128/82 | HR 106 | Temp 98.5°F | Ht 66.0 in | Wt 217.0 lb

## 2023-02-19 DIAGNOSIS — I10 Essential (primary) hypertension: Secondary | ICD-10-CM | POA: Diagnosis not present

## 2023-02-19 DIAGNOSIS — S46211D Strain of muscle, fascia and tendon of other parts of biceps, right arm, subsequent encounter: Secondary | ICD-10-CM | POA: Diagnosis not present

## 2023-02-19 DIAGNOSIS — E7800 Pure hypercholesterolemia, unspecified: Secondary | ICD-10-CM

## 2023-02-19 DIAGNOSIS — K219 Gastro-esophageal reflux disease without esophagitis: Secondary | ICD-10-CM

## 2023-02-19 NOTE — Progress Notes (Signed)
Marikay Alar, MD Phone: [phone]  Shawn English is a 47 y.o. male who presents today for f/u.  HYPERTENSION Disease Monitoring Home BP Monitoring 120s/70s Chest pain- no    Dyspnea- no Medications Compliance-  taking HCTZ, losartan.   Edema- no BMET    Component Value Date/Time   NA 142 09/08/2022 0854   K 3.8 09/08/2022 0854   CL 100 09/08/2022 0854   CO2 25 09/08/2022 0854   GLUCOSE 94 09/08/2022 0854   GLUCOSE 89 08/08/2018 0826   BUN 16 09/08/2022 0854   CREATININE 0.95 09/08/2022 0854   CALCIUM 9.8 09/08/2022 0854   Right elbow pain: Patient notes this has pretty much resolved.  He sees sports medicine next week.  He has started doing basic exercises for this and notes he lifted 50 pound dumbbells the other day when sports medicine advised to stick to nothing more than 35 pounds.  Patient is moving to [facility] though he is planning to keep his job here and be in [place] during the week for work so he will at this time continue to see me as his PCP.  Social History   Tobacco Use  Smoking Status Former   Types: Cigarettes   Quit date: 09/09/2014   Years since quitting: 8.4  Smokeless Tobacco Never    Current Outpatient Medications on File Prior to Visit  Medication Sig Dispense Refill   EPINEPHrine 0.3 mg/0.3 mL IJ SOAJ injection Inject 0.3 mLs (0.3 mg total) into the muscle as needed. 2 each 0   hydrochlorothiazide (HYDRODIURIL) 25 MG tablet Take 1 tablet (25 mg total) by mouth daily. 90 tablet 0   losartan (COZAAR) 100 MG tablet TAKE 1 TABLET(100 MG) BY MOUTH DAILY 90 tablet 1   No current facility-administered medications on file prior to visit.     ROS see history of present illness  Objective  Physical Exam Vitals:   02/19/23 0812 02/19/23 0917  BP: 132/86 128/82  Pulse: (!) 106   Temp: 98.5 F (36.9 C)   SpO2: 97%     BP Readings from Last 3 Encounters:  02/19/23 128/82  12/24/22 (!) 152/98  11/13/22 130/82   Wt Readings from Last 3  Encounters:  02/19/23 217 lb (98.4 kg)  12/24/22 217 lb (98.4 kg)  11/13/22 215 lb (97.5 kg)    Physical Exam Constitutional:      General: He is not in acute distress.    Appearance: He is not diaphoretic.  Cardiovascular:     Rate and Rhythm: Normal rate and regular rhythm.     Heart sounds: Normal heart sounds.  Pulmonary:     Effort: Pulmonary effort is normal.     Breath sounds: Normal breath sounds.  Musculoskeletal:     Right lower leg: No edema.     Left lower leg: No edema.  Skin:    General: Skin is warm and dry.  Neurological:     Mental Status: He is alert.      Assessment/Plan: Please see individual problem list.  Essential hypertension Assessment & Plan: Chronic issue.  Adequately controlled at home.  Patient will continue HCTZ 25 mg daily and losartan 100 mg daily.  Patient prefers to do lab work in a month or so.  Orders: -     Comprehensive metabolic panel; Future  Rupture of right distal biceps tendon, subsequent encounter Assessment & Plan: Seems to have healed up at this point.  He will continue to see sports medicine for this.  Return in about 1 month (around 03/21/2023) for Labs, 6 months with PCP for physical.   Marikay Alar, MD [facility] [place] [facility]

## 2023-02-19 NOTE — Assessment & Plan Note (Signed)
Seems to have healed up at this point.  He will continue to see sports medicine for this.

## 2023-02-19 NOTE — Assessment & Plan Note (Signed)
Chronic issue.  Adequately controlled at home.  Patient will continue HCTZ 25 mg daily and losartan 100 mg daily.  Patient prefers to do lab work in a month or so.

## 2023-02-24 NOTE — Progress Notes (Deleted)
[name] [address] [place] [address] Phone: [phone] Subjective:    I'm seeing this patient by the request  of:  Glori Luis, MD  CC:   [id number]  11/13/2022 Patient is making improvements and it does seem to have some interval improvement of the tendon.  Patient does have some improvement in range of motion noted today. Compression, icing still discussed.  Patient states that for pain medication he is doing well with over-the-counter at the moment.  Discussed worsening pain or weakness to come back sooner otherwise follow-up again in 6 weeks after doing home exercises regularly.  Continue the nitroglycerin patches with patient is not having any side effects.    Update 02/25/2023 Shawn English is a 47 y.o. male coming in with complaint of R arm pain. Patient states      Past Medical History:  Diagnosis Date   Chicken pox    Hypertension    Past Surgical History:  Procedure Laterality Date   CYST REMOVAL HAND  11/10/2007   Benign cyst removed from right arm   Left shoulder surgery Left 02/21/2022   Social History   Socioeconomic History   Marital status: Married    Spouse name: Not on file   Number of children: Not on file   Years of education: Not on file   Highest education level: Not on file  Occupational History   Not on file  Tobacco Use   Smoking status: Former    Types: Cigarettes    Quit date: 09/09/2014    Years since quitting: 8.4   Smokeless tobacco: Never  Substance and Sexual Activity   Alcohol use: Yes    Alcohol/week: 1.0 standard drink of alcohol    Types: 1 Standard drinks or equivalent per week    Comment: Occasional use    Drug use: No   Sexual activity: Yes    Partners: Female    Comment: Fiance  Other Topics Concern   Not on file  Social History Narrative   Lives with fiance    Child on the way, girl, due in July 2016    No Pets    Work at [employer]    Left  handed   Caffeine- 2 cups of coffee, occasional soda    Enjoys playing guitar and traveling    Social Determinants of Corporate investment banker Strain: Not on file  Food Insecurity: Not on file  Transportation Needs: Not on file  Physical Activity: Not on file  Stress: Not on file  Social Connections: Not on file   Allergies  Allergen Reactions   Bee Venom Swelling   Fish-Derived Products Anaphylaxis    Fish oil included   Penicillins Anaphylaxis   Sulfa Antibiotics Nausea And Vomiting   Family History  Problem Relation Age of Onset   Hypertension Father    Cancer Maternal Grandmother        breast     Current Outpatient Medications (Cardiovascular):    EPINEPHrine 0.3 mg/0.3 mL IJ SOAJ injection, Inject 0.3 mLs (0.3 mg total) into the muscle as needed.   hydrochlorothiazide (HYDRODIURIL) 25 MG tablet, Take 1 tablet (25 mg total) by mouth daily.   losartan (COZAAR) 100 MG tablet, TAKE 1 TABLET(100 MG) BY MOUTH DAILY       Reviewed prior external information including notes and imaging from  primary care provider As well as notes that were available from care everywhere and other  healthcare systems.  Past medical history, social, surgical and family history all reviewed in electronic medical record.  No pertanent information unless stated regarding to the chief complaint.   Review of Systems:  No headache, visual changes, nausea, vomiting, diarrhea, constipation, dizziness, abdominal pain, skin rash, fevers, chills, night sweats, weight loss, swollen lymph nodes, body aches, joint swelling, chest pain, shortness of breath, mood changes. POSITIVE muscle aches  Objective  There were no vitals taken for this visit.   General: No apparent distress alert and oriented x3 mood and affect normal, dressed appropriately.  HEENT: Pupils equal, extraocular movements intact  Respiratory: Patient's speak in full sentences and does not appear short of breath  Cardiovascular:  No lower extremity edema, non tender, no erythema      Impression and Recommendations:

## 2023-02-25 ENCOUNTER — Ambulatory Visit: Payer: BC Managed Care – PPO | Admitting: Family Medicine

## 2023-03-01 NOTE — Progress Notes (Signed)
[name] [address] [place] [address] Phone: [phone] Subjective:   Bruce Donath, am serving as a scribe for Dr. Antoine Primas.  I'm seeing this patient by the request  of:  Glori Luis, MD  CC: Right elbow pain  [id number]  12/24/2022 Significant improvement noted at this time.  Scar tissue formation noted on the ultrasound.  Discussed compression slowly getting patient back to lifting mechanics.  Discussed continued compression with activity.  No icing regimen, patient on ultrasound did have some hydration issues and we did discuss about that as well.  Follow-up again in 6 to 8 weeks     Update 03/03/2023 Shawn English is a 47 y.o. male coming in with complaint of R elbow pain. Patient states that he is doing well. Is back in the gym wo pain.      Past Medical History:  Diagnosis Date   Chicken pox    Hypertension    Past Surgical History:  Procedure Laterality Date   CYST REMOVAL HAND  11/10/2007   Benign cyst removed from right arm   Left shoulder surgery Left 02/21/2022   Social History   Socioeconomic History   Marital status: Married    Spouse name: Not on file   Number of children: Not on file   Years of education: Not on file   Highest education level: Not on file  Occupational History   Not on file  Tobacco Use   Smoking status: Former    Types: Cigarettes    Quit date: 09/09/2014    Years since quitting: 8.4   Smokeless tobacco: Never  Substance and Sexual Activity   Alcohol use: Yes    Alcohol/week: 1.0 standard drink of alcohol    Types: 1 Standard drinks or equivalent per week    Comment: Occasional use    Drug use: No   Sexual activity: Yes    Partners: Female    Comment: Fiance  Other Topics Concern   Not on file  Social History Narrative   Lives with fiance    Child on the way, girl, due in July 2016    No Pets    Work at [employer]    Left handed   Caffeine- 2  cups of coffee, occasional soda    Enjoys playing guitar and traveling    Social Determinants of Corporate investment banker Strain: Not on file  Food Insecurity: Not on file  Transportation Needs: Not on file  Physical Activity: Not on file  Stress: Not on file  Social Connections: Not on file   Allergies  Allergen Reactions   Bee Venom Swelling   Fish-Derived Products Anaphylaxis    Fish oil included   Penicillins Anaphylaxis   Sulfa Antibiotics Nausea And Vomiting   Family History  Problem Relation Age of Onset   Hypertension Father    Cancer Maternal Grandmother        breast     Current Outpatient Medications (Cardiovascular):    EPINEPHrine 0.3 mg/0.3 mL IJ SOAJ injection, Inject 0.3 mLs (0.3 mg total) into the muscle as needed.   hydrochlorothiazide (HYDRODIURIL) 25 MG tablet, Take 1 tablet (25 mg total) by mouth daily.   losartan (COZAAR) 100 MG tablet, TAKE 1 TABLET(100 MG) BY MOUTH DAILY      Objective  Blood pressure (!) 122/90, pulse (!) 106, height  (1.676 m), weight 219 lb (99.3 kg), SpO2 98 %.  General: No apparent distress alert and oriented x3 mood and affect normal, dressed appropriately.  HEENT: Pupils equal, extraocular movements intact  Respiratory: Patient's speak in full sentences and does not appear short of breath  Cardiovascular: No lower extremity edema, non tender, no erythema  Right elbow exam has full range of motion noted.  Some very slight discomfort with resisted supination but otherwise full strength of the bicep noted.    Impression and Recommendations:    The above documentation has been reviewed and is accurate and complete Judi Saa, DO

## 2023-03-03 ENCOUNTER — Ambulatory Visit: Payer: BC Managed Care – PPO | Admitting: Family Medicine

## 2023-03-03 VITALS — BP 122/90 | HR 106 | Ht 66.0 in | Wt 219.0 lb

## 2023-03-03 DIAGNOSIS — S46211D Strain of muscle, fascia and tendon of other parts of biceps, right arm, subsequent encounter: Secondary | ICD-10-CM

## 2023-03-03 NOTE — Assessment & Plan Note (Signed)
Significant improvement at this point.  At this point can follow-up as needed

## 2023-03-22 ENCOUNTER — Other Ambulatory Visit (INDEPENDENT_AMBULATORY_CARE_PROVIDER_SITE_OTHER): Payer: BC Managed Care – PPO

## 2023-03-22 DIAGNOSIS — I10 Essential (primary) hypertension: Secondary | ICD-10-CM | POA: Diagnosis not present

## 2023-03-23 LAB — COMPREHENSIVE METABOLIC PANEL
ALT: 47 IU/L — ABNORMAL HIGH (ref 0–44)
AST: 26 IU/L (ref 0–40)
Albumin/Globulin Ratio: 2.2 (ref 1.2–2.2)
Albumin: 4.6 g/dL (ref 4.1–5.1)
Alkaline Phosphatase: 77 IU/L (ref 44–121)
BUN/Creatinine Ratio: 16 (ref 9–20)
BUN: 14 mg/dL (ref 6–24)
Bilirubin Total: 0.5 mg/dL (ref 0.0–1.2)
CO2: 23 mmol/L (ref 20–29)
Calcium: 9.5 mg/dL (ref 8.7–10.2)
Chloride: 101 mmol/L (ref 96–106)
Creatinine, Ser: 0.9 mg/dL (ref 0.76–1.27)
Globulin, Total: 2.1 g/dL (ref 1.5–4.5)
Glucose: 92 mg/dL (ref 70–99)
Potassium: 3.9 mmol/L (ref 3.5–5.2)
Sodium: 141 mmol/L (ref 134–144)
Total Protein: 6.7 g/dL (ref 6.0–8.5)
eGFR: 108 mL/min/{1.73_m2} (ref >59)

## 2023-04-16 ENCOUNTER — Other Ambulatory Visit: Payer: Self-pay | Admitting: Family Medicine

## 2023-04-16 DIAGNOSIS — I10 Essential (primary) hypertension: Secondary | ICD-10-CM

## 2023-07-13 ENCOUNTER — Other Ambulatory Visit: Payer: Self-pay | Admitting: Family Medicine

## 2023-07-13 DIAGNOSIS — I10 Essential (primary) hypertension: Secondary | ICD-10-CM

## 2023-08-24 ENCOUNTER — Encounter: Payer: Self-pay | Admitting: Family Medicine

## 2023-08-24 ENCOUNTER — Ambulatory Visit (INDEPENDENT_AMBULATORY_CARE_PROVIDER_SITE_OTHER): Payer: BC Managed Care – PPO | Admitting: Family Medicine

## 2023-08-24 VITALS — BP 128/80 | HR 85 | Temp 98.3°F | Ht 66.0 in | Wt 218.4 lb

## 2023-08-24 DIAGNOSIS — E7800 Pure hypercholesterolemia, unspecified: Secondary | ICD-10-CM

## 2023-08-24 DIAGNOSIS — E6609 Other obesity due to excess calories: Secondary | ICD-10-CM

## 2023-08-24 DIAGNOSIS — Z0000 Encounter for general adult medical examination without abnormal findings: Secondary | ICD-10-CM | POA: Diagnosis not present

## 2023-08-24 DIAGNOSIS — Z6834 Body mass index (BMI) 34.0-34.9, adult: Secondary | ICD-10-CM

## 2023-08-24 DIAGNOSIS — E66811 Obesity, class 1: Secondary | ICD-10-CM

## 2023-08-24 DIAGNOSIS — Z1211 Encounter for screening for malignant neoplasm of colon: Secondary | ICD-10-CM

## 2023-08-24 NOTE — Assessment & Plan Note (Signed)
Physical exam completed.  Encouraged healthy diet and getting back to the gym to exercise.  Colon cancer screening is due.  GI referral placed.  Patient Clines flu vaccine and updated COVID vaccination.  I encouraged patient to see the dentist and the ophthalmologist.  Lab work as outlined.

## 2023-08-24 NOTE — Progress Notes (Signed)
Marikay Alar, MD Phone: [phone]  Shawn English is a 47 y.o. male who presents today for CPE.  Diet: Patient notes he is eating out more than he should.  He is trying to eat as healthy as he is able to when he eats out.  Sweet tea 2 times a week. Exercise: Has not been going to the gym.  They are working on moving and he has a home gym picked out though has no place to put yet. Colonoscopy: Due Prostate cancer screening: Not indicated Family history-  Prostate cancer: no  Colon cancer: no Vaccines-   Flu: declines  Tetanus: UTD  COVID19: x2 HIV screening: UTD Hep C Screening: UTD Tobacco use: no Alcohol use: none since mid september Illicit Drug use: no Dentist: no Ophthalmology: no   Active Ambulatory Problems    Diagnosis Date Noted   Essential hypertension 03/21/2015   Multiple allergies 05/08/2015   Obesity 06/04/2016   History of anaphylaxis 08/24/2016   Cervical radiculopathy at C8 08/13/2017   Allergic rhinitis 01/19/2018   Fatty liver disease, nonalcoholic 01/02/2020   Elevated LDL cholesterol level 01/02/2020   Toenail deformity 08/18/2021   Left rotator cuff tear 12/08/2021   Lateral epicondylitis of left elbow 12/08/2021   Routine general medical examination at a health care facility 08/19/2022   GERD (gastroesophageal reflux disease) 10/27/2022   Right forearm pain 10/27/2022   Rupture of right distal biceps tendon 10/28/2022   Resolved Ambulatory Problems    Diagnosis Date Noted   Encounter to establish care 03/21/2015   Neck pain, musculoskeletal 03/21/2015   Need for prophylactic vaccination and inoculation against influenza 07/31/2015   Skin sensitivity 12/31/2016   Past Medical History:  Diagnosis Date   Chicken pox    Hypertension     Family History  Problem Relation Age of Onset   Hypertension Father    Cancer Maternal Grandmother        breast    Social History   Socioeconomic History   Marital status: Married    Spouse  name: Not on file   Number of children: Not on file   Years of education: Not on file   Highest education level: Not on file  Occupational History   Not on file  Tobacco Use   Smoking status: Former    Current packs/day: 0.00    Types: Cigarettes    Quit date: 09/09/2014    Years since quitting: 8.9   Smokeless tobacco: Never  Substance and Sexual Activity   Alcohol use: Yes    Alcohol/week: 1.0 standard drink of alcohol    Types: 1 Standard drinks or equivalent per week    Comment: Occasional use    Drug use: No   Sexual activity: Yes    Partners: Female    Comment: Fiance  Other Topics Concern   Not on file  Social History Narrative   Lives with fiance    Child on the way, girl, due in July 2016    No Pets    Work at [employer]    Left handed   Caffeine- 2 cups of coffee, occasional soda    Enjoys playing guitar and traveling    Social Determinants of Corporate investment banker Strain: Not on file  Food Insecurity: Not on file  Transportation Needs: Not on file  Physical Activity: Not on file  Stress: Not on file  Social Connections: Not on file  Intimate Partner Violence: Not on file  ROS  General:  Negative for nexplained weight loss, fever Skin: Negative for new or changing mole, sore that won't heal HEENT: Negative for trouble hearing, trouble seeing, ringing in ears, mouth sores, hoarseness, change in voice, dysphagia. CV:  Negative for chest pain, dyspnea, edema, palpitations Resp: Negative for cough, dyspnea, hemoptysis GI: Negative for nausea, vomiting, diarrhea, constipation, abdominal pain, melena, hematochezia. GU: Negative for dysuria, incontinence, urinary hesitance, hematuria, vaginal or penile discharge, polyuria, sexual difficulty, lumps in testicle or breasts MSK: Negative for muscle cramps or aches, joint pain or swelling Neuro: Negative for headaches, weakness, numbness, dizziness, passing out/fainting Psych: Negative  for depression, anxiety, memory problems  Objective  Physical Exam Vitals:   08/24/23 0848  BP: 128/80  Pulse: 85  Temp: 98.3 F (36.8 C)  SpO2: 97%    BP Readings from Last 3 Encounters:  08/24/23 128/80  03/03/23 (!) 122/90  02/19/23 128/82   Wt Readings from Last 3 Encounters:  08/24/23 218 lb 6.4 oz (99.1 kg)  03/03/23 219 lb (99.3 kg)  02/19/23 217 lb (98.4 kg)    Physical Exam Constitutional:      General: He is not in acute distress.    Appearance: He is not diaphoretic.  HENT:     Head: Normocephalic and atraumatic.  Cardiovascular:     Rate and Rhythm: Normal rate and regular rhythm.     Heart sounds: Normal heart sounds.  Pulmonary:     Effort: Pulmonary effort is normal.     Breath sounds: Normal breath sounds.  Abdominal:     General: Bowel sounds are normal. There is no distension.     Palpations: Abdomen is soft.     Tenderness: There is no abdominal tenderness.  Musculoskeletal:     Right lower leg: No edema.     Left lower leg: No edema.  Lymphadenopathy:     Cervical: No cervical adenopathy.  Skin:    General: Skin is warm and dry.  Neurological:     Mental Status: He is alert.  Psychiatric:        Mood and Affect: Mood normal.      Assessment/Plan:   Routine general medical examination at a health care facility Assessment & Plan: Physical exam completed.  Encouraged healthy diet and getting back to the gym to exercise.  Colon cancer screening is due.  GI referral placed.  Patient Clines flu vaccine and updated COVID vaccination.  I encouraged patient to see the dentist and the ophthalmologist.  Lab work as outlined.   Class 1 obesity due to excess calories without serious comorbidity with body mass index (BMI) of 34.0 to 34.9 in adult -     Hemoglobin A1c  Elevated LDL cholesterol level -     Comprehensive metabolic panel -     Lipid panel  Colon cancer screening -     Ambulatory referral to Gastroenterology    Return in  about 6 months (around 02/22/2024) for transfer of care.   Marikay Alar, MD [facility]

## 2023-08-25 ENCOUNTER — Telehealth: Payer: Self-pay | Admitting: Gastroenterology

## 2023-08-25 LAB — COMPREHENSIVE METABOLIC PANEL
ALT: 54 [IU]/L — ABNORMAL HIGH (ref 0–44)
AST: 28 [IU]/L (ref 0–40)
Albumin: 4.5 g/dL (ref 4.1–5.1)
Alkaline Phosphatase: 75 [IU]/L (ref 44–121)
BUN/Creatinine Ratio: 10 (ref 9–20)
BUN: 10 mg/dL (ref 6–24)
Bilirubin Total: 0.5 mg/dL (ref 0.0–1.2)
CO2: 22 mmol/L (ref 20–29)
Calcium: 9.3 mg/dL (ref 8.7–10.2)
Chloride: 104 mmol/L (ref 96–106)
Creatinine, Ser: 1.03 mg/dL (ref 0.76–1.27)
Globulin, Total: 2 g/dL (ref 1.5–4.5)
Glucose: 83 mg/dL (ref 70–99)
Potassium: 3.9 mmol/L (ref 3.5–5.2)
Sodium: 142 mmol/L (ref 134–144)
Total Protein: 6.5 g/dL (ref 6.0–8.5)
eGFR: 91 mL/min/{1.73_m2} (ref >59)

## 2023-08-25 LAB — HEMOGLOBIN A1C
Est. average glucose Bld gHb Est-mCnc: 94 mg/dL
Hgb A1c MFr Bld: 4.9 % (ref 4.8–5.6)

## 2023-08-25 LAB — LIPID PANEL
Chol/HDL Ratio: 5.1 {ratio} — ABNORMAL HIGH (ref 0.0–5.0)
Cholesterol, Total: 182 mg/dL (ref 100–199)
HDL: 36 mg/dL — ABNORMAL LOW (ref >39)
LDL Chol Calc (NIH): 124 mg/dL — ABNORMAL HIGH (ref 0–99)
Triglycerides: 123 mg/dL (ref 0–149)
VLDL Cholesterol Cal: 22 mg/dL (ref 5–40)

## 2023-08-25 NOTE — Telephone Encounter (Signed)
Patient called back to schedule his procedure.

## 2023-08-30 ENCOUNTER — Telehealth: Payer: Self-pay

## 2023-08-30 ENCOUNTER — Other Ambulatory Visit: Payer: Self-pay

## 2023-08-30 DIAGNOSIS — Z1211 Encounter for screening for malignant neoplasm of colon: Secondary | ICD-10-CM

## 2023-08-30 MED ORDER — GOLYTELY 236 G PO SOLR: 4000.0000 mL | Freq: Once | ORAL | 0 refills | Status: AC

## 2023-08-30 NOTE — Telephone Encounter (Signed)
Pt reqeusting call back to schedule colonoscopy

## 2023-08-30 NOTE — Telephone Encounter (Signed)
Gastroenterology Pre-Procedure Review  Request Date: 09/15/23 Requesting Physician: Dr. Allegra Lai  PATIENT REVIEW QUESTIONS: The patient responded to the following health history questions as indicated:    1. Are you having any GI issues? no 2. Do you have a personal history of Polyps? no 3. Do you have a family history of Colon Cancer or Polyps? no 4. Diabetes Mellitus? no 5. Joint replacements in the past 12 months?no 6. Major health problems in the past 3 months?no 7. Any artificial heart valves, MVP, or defibrillator?no    MEDICATIONS & ALLERGIES:    Patient reports the following regarding taking any anticoagulation/antiplatelet therapy:   Plavix, Coumadin, Eliquis, Xarelto, Lovenox, Pradaxa, Brilinta, or Effient? no Aspirin? no  Patient confirms/reports the following medications:  Current Outpatient Medications  Medication Sig Dispense Refill   EPINEPHrine 0.3 mg/0.3 mL IJ SOAJ injection Inject 0.3 mLs (0.3 mg total) into the muscle as needed. 2 each 0   hydrochlorothiazide (HYDRODIURIL) 25 MG tablet TAKE 1 TABLET(25 MG) BY MOUTH DAILY 90 tablet 1   losartan (COZAAR) 100 MG tablet TAKE 1 TABLET(100 MG) BY MOUTH DAILY 90 tablet 1   No current facility-administered medications for this visit.    Patient confirms/reports the following allergies:  Allergies  Allergen Reactions   Bee Venom Swelling   Fish-Derived Products Anaphylaxis    Fish oil included   Penicillins Anaphylaxis   Sulfa Antibiotics Nausea And Vomiting    No orders of the defined types were placed in this encounter.   AUTHORIZATION INFORMATION Primary Insurance: 1D#: Group #:  Secondary Insurance: 1D#: Group #:  SCHEDULE INFORMATION: Date: 09/15/23 Time: Location: [facility]

## 2023-09-08 ENCOUNTER — Encounter: Payer: Self-pay | Admitting: Gastroenterology

## 2023-09-15 ENCOUNTER — Ambulatory Visit
Admission: RE | Admit: 2023-09-15 | Discharge: 2023-09-15 | Disposition: A | Discharge status: Home / Self Care | Payer: BC Managed Care – PPO | Attending: Gastroenterology | Admitting: Gastroenterology

## 2023-09-15 ENCOUNTER — Ambulatory Visit: Payer: BC Managed Care – PPO | Admitting: Anesthesiology

## 2023-09-15 ENCOUNTER — Encounter
Admission: RE | Disposition: A | Discharge status: Home / Self Care | Payer: Self-pay | Source: Home / Self Care | Attending: Gastroenterology

## 2023-09-15 DIAGNOSIS — K635 Polyp of colon: Secondary | ICD-10-CM

## 2023-09-15 DIAGNOSIS — D125 Benign neoplasm of sigmoid colon: Secondary | ICD-10-CM | POA: Diagnosis not present

## 2023-09-15 DIAGNOSIS — E669 Obesity, unspecified: Secondary | ICD-10-CM | POA: Insufficient documentation

## 2023-09-15 DIAGNOSIS — D123 Benign neoplasm of transverse colon: Secondary | ICD-10-CM | POA: Insufficient documentation

## 2023-09-15 DIAGNOSIS — Z1211 Encounter for screening for malignant neoplasm of colon: Secondary | ICD-10-CM | POA: Diagnosis not present

## 2023-09-15 DIAGNOSIS — I10 Essential (primary) hypertension: Secondary | ICD-10-CM | POA: Insufficient documentation

## 2023-09-15 DIAGNOSIS — Z6834 Body mass index (BMI) 34.0-34.9, adult: Secondary | ICD-10-CM | POA: Diagnosis not present

## 2023-09-15 DIAGNOSIS — D128 Benign neoplasm of rectum: Secondary | ICD-10-CM | POA: Insufficient documentation

## 2023-09-15 DIAGNOSIS — Z87891 Personal history of nicotine dependence: Secondary | ICD-10-CM | POA: Insufficient documentation

## 2023-09-15 DIAGNOSIS — K621 Rectal polyp: Secondary | ICD-10-CM

## 2023-09-15 HISTORY — PX: HOT HEMOSTASIS: SHX5433

## 2023-09-15 HISTORY — PX: COLONOSCOPY WITH PROPOFOL: SHX5780

## 2023-09-15 HISTORY — PX: POLYPECTOMY: SHX5525

## 2023-09-15 SURGERY — COLONOSCOPY WITH PROPOFOL
Anesthesia: General

## 2023-09-15 MED ORDER — LIDOCAINE HCL (CARDIAC) PF 100 MG/5ML IV SOSY
PREFILLED_SYRINGE | INTRAVENOUS | Status: DC | PRN
  Administered 2023-09-15: 100 mg via INTRAVENOUS

## 2023-09-15 MED ORDER — SUCCINYLCHOLINE CHLORIDE 200 MG/10ML IV SOSY
PREFILLED_SYRINGE | INTRAVENOUS | Status: DC | PRN
  Administered 2023-09-15: 120 mg via INTRAVENOUS

## 2023-09-15 MED ORDER — SUGAMMADEX SODIUM 200 MG/2ML IV SOLN
INTRAVENOUS | Status: DC | PRN
  Administered 2023-09-15: 100 mg via INTRAVENOUS

## 2023-09-15 MED ORDER — GLYCOPYRROLATE 0.2 MG/ML IJ SOLN
INTRAMUSCULAR | Status: DC | PRN
  Administered 2023-09-15: .2 mg via INTRAVENOUS

## 2023-09-15 MED ORDER — MIDAZOLAM HCL 2 MG/2ML IJ SOLN
INTRAMUSCULAR | Status: AC
  Filled 2023-09-15: qty 2

## 2023-09-15 MED ORDER — MIDAZOLAM HCL 2 MG/2ML IJ SOLN
INTRAMUSCULAR | Status: DC | PRN
  Administered 2023-09-15: 2 mg via INTRAVENOUS

## 2023-09-15 MED ORDER — ESMOLOL HCL 100 MG/10ML IV SOLN
INTRAVENOUS | Status: DC | PRN
  Administered 2023-09-15: 10 mg via INTRAVENOUS

## 2023-09-15 MED ORDER — SODIUM CHLORIDE 0.9 % IV SOLN
INTRAVENOUS | Status: DC
  Administered 2023-09-15: 20 mL/h via INTRAVENOUS

## 2023-09-15 MED ORDER — ROCURONIUM BROMIDE 100 MG/10ML IV SOLN
INTRAVENOUS | Status: DC | PRN
  Administered 2023-09-15: 20 mg via INTRAVENOUS

## 2023-09-15 MED ORDER — PROPOFOL 10 MG/ML IV BOLUS
INTRAVENOUS | Status: DC | PRN
  Administered 2023-09-15: 10 mg via INTRAVENOUS
  Administered 2023-09-15: 50 mg via INTRAVENOUS
  Administered 2023-09-15: 70 mg via INTRAVENOUS

## 2023-09-15 MED ORDER — PROPOFOL 500 MG/50ML IV EMUL
INTRAVENOUS | Status: DC | PRN
  Administered 2023-09-15: 165 ug/kg/min via INTRAVENOUS

## 2023-09-15 NOTE — Op Note (Signed)
[facility] Gastroenterology Patient Name: Shawn English Procedure Date: 09/15/2023 10:42 AM MRN: [id number] Account #: [id number] Date of Birth: 04/24/1976 Admit Type: Outpatient Age: 47 Room: [facility] ENDO ROOM 1 Gender: Male Note Status: Finalized Instrument Name: Prentice Docker [id number] Procedure:             Colonoscopy Indications:           Screening for colorectal malignant neoplasm, This is                         the patient's first colonoscopy Providers:             Toney Reil MD, MD Referring MD:          Toney Reil MD, MD (Referring MD), Yehuda Mao.                         Birdie Sons (Referring MD) Medicines:             General Anesthesia Complications:         No immediate complications. Procedure:             Pre-Anesthesia Assessment:                        - Prior to the procedure, a History and Physical was                         performed, and patient medications and allergies were                         reviewed. The patient is competent. The risks and                         benefits of the procedure and the sedation options and                         risks were discussed with the patient. All questions                         were answered and informed consent was obtained.                         Patient identification and proposed procedure were                         verified by the physician, the nurse, the                         anesthesiologist, the anesthetist and the technician                         in the pre-procedure area in the procedure room in the                         endoscopy suite. Mental Status Examination: alert and                         oriented. Airway Examination: normal oropharyngeal  airway and neck mobility. Respiratory Examination:                         clear to auscultation. CV Examination: normal.                         Prophylactic Antibiotics: The patient does not require                          prophylactic antibiotics. Prior Anticoagulants: The                         patient has taken no anticoagulant or antiplatelet                         agents. ASA Grade Assessment: II - A patient with mild                         systemic disease. After reviewing the risks and                         benefits, the patient was deemed in satisfactory                         condition to undergo the procedure. The anesthesia                         plan was to use general anesthesia. Immediately prior                         to administration of medications, the patient was                         re-assessed for adequacy to receive sedatives. The                         heart rate, respiratory rate, oxygen saturations,                         blood pressure, adequacy of pulmonary ventilation, and                         response to care were monitored throughout the                         procedure. The physical status of the patient was                         re-assessed after the procedure.                        After obtaining informed consent, the colonoscope was                         passed under direct vision. Throughout the procedure,                         the patient's blood pressure, pulse, and oxygen  saturations were monitored continuously. The                         Colonoscope was introduced through the anus and                         advanced to the the cecum, identified by appendiceal                         orifice and ileocecal valve. The colonoscopy was                         extremely difficult due to the patient's respiratory                         instability (hypoxia). Successful completion of the                         procedure was aided by decreasing the dose of sedation                         medication, Anesthesia staff assisting with sedation                         and treating with ventilation. The  patient tolerated                         the procedure well. The quality of the bowel                         preparation was evaluated using the BBPS [facility] Bowel                         Preparation Scale) with scores of: Right Colon = 3,                         Transverse Colon = 3 and Left Colon = 3 (entire mucosa                         seen well with no residual staining, small fragments                         of stool or opaque liquid). The total BBPS score                         equals 9. The ileocecal valve, appendiceal orifice,                         and rectum were photographed. Findings:      The perianal and digital rectal examinations were normal. Pertinent       negatives include normal sphincter tone and no palpable rectal lesions.      A 3 mm polyp was found in the proximal transverse colon. The polyp was       sessile. The polyp was removed with a jumbo cold forceps. Resection and       retrieval were complete. Estimated blood loss: none.      A 4 mm  polyp was found in the sigmoid colon. The polyp was sessile. The       polyp was removed with a cold snare. Resection and retrieval were       complete. Estimated blood loss: none.      A 15 mm polyp was found in the rectum. The polyp was pedunculated. The       polyp was removed with a hot snare. Resection and retrieval were       complete. Estimated blood loss: none.      The retroflexed view of the distal rectum and anal verge was normal and       showed no anal or rectal abnormalities. Impression:            - One 3 mm polyp in the proximal transverse colon,                         removed with a jumbo cold forceps. Resected and                         retrieved.                        - One 4 mm polyp in the sigmoid colon, removed with a                         cold snare. Resected and retrieved.                        - One 15 mm polyp in the rectum, removed with a hot                         snare. Resected and  retrieved.                        - The distal rectum and anal verge are normal on                         retroflexion view. Recommendation:        - Discharge patient to home (with escort).                        - Resume previous diet today.                        - Continue present medications.                        - Await pathology results.                        - Repeat colonoscopy in 3 years for surveillance of                         multiple polyps. Procedure Code(s):     --- Professional ---                        [phone], Colonoscopy, flexible; with removal of  tumor(s), polyp(s), or other lesion(s) by snare                         technique                        45380, 59, Colonoscopy, flexible; with biopsy, single                         or multiple Diagnosis Code(s):     --- Professional ---                        Z12.11, Encounter for screening for malignant neoplasm                         of colon                        D12.3, Benign neoplasm of transverse colon (hepatic                         flexure or splenic flexure)                        D12.5, Benign neoplasm of sigmoid colon                        D12.8, Benign neoplasm of rectum CPT copyright 2022 American Medical Association. All rights reserved. The codes documented in this report are preliminary and upon coder review may  be revised to meet current compliance requirements. Dr. Libby Maw Toney Reil MD, MD 09/15/2023 11:26:54 AM This report has been signed electronically. Number of Addenda: 0 Note Initiated On: 09/15/2023 10:42 AM Scope Withdrawal Time: 0 hours 25 minutes 55 seconds  Total Procedure Duration: 0 hours 33 minutes 55 seconds  Estimated Blood Loss:  Estimated blood loss: none.      [facility]

## 2023-09-15 NOTE — Anesthesia Procedure Notes (Signed)
Procedure Name: General with mask airway Date/Time: 09/15/2023 10:45 AM  Performed by: Mohammed Kindle, CRNAPre-anesthesia Checklist: Patient identified, Emergency Drugs available, Suction available and Patient being monitored Patient Re-evaluated:Patient Re-evaluated prior to induction Oxygen Delivery Method: Simple face mask Induction Type: IV induction Placement Confirmation: positive ETCO2, CO2 detector and breath sounds checked- equal and bilateral Dental Injury: Teeth and Oropharynx as per pre-operative assessment

## 2023-09-15 NOTE — Anesthesia Postprocedure Evaluation (Signed)
Anesthesia Post Note  Patient: Mirko Tailor  Procedure(s) Performed: COLONOSCOPY WITH PROPOFOL POLYPECTOMY HOT HEMOSTASIS (ARGON PLASMA COAGULATION/BICAP)  Patient location during evaluation: PACU Anesthesia Type: General Level of consciousness: awake and alert, oriented and patient cooperative Pain management: pain level controlled Vital Signs Assessment: post-procedure vital signs reviewed and stable Respiratory status: spontaneous breathing, nonlabored ventilation and respiratory function stable Cardiovascular status: blood pressure returned to baseline and stable Postop Assessment: adequate PO intake Anesthetic complications: no   No notable events documented.   Last Vitals:  Vitals:   09/15/23 1133 09/15/23 1143  BP: 123/68 (!) 136/90  Pulse: (!) 112 (!) 108  Resp: 16 16  Temp:    SpO2: 94% 96%    Last Pain:  Vitals:   09/15/23 1143  TempSrc:   PainSc: 0-No pain                 Reed Breech

## 2023-09-15 NOTE — Transfer of Care (Signed)
Immediate Anesthesia Transfer of Care Note  Patient: Shawn English  Procedure(s) Performed: COLONOSCOPY WITH PROPOFOL POLYPECTOMY HOT HEMOSTASIS (ARGON PLASMA COAGULATION/BICAP)  Patient Location: Endoscopy Unit  Anesthesia Type:General  Level of Consciousness: awake, drowsy, and patient cooperative  Airway & Oxygen Therapy: Patient Spontanous Breathing and Patient connected to face mask oxygen  Post-op Assessment: Report given to RN and Post -op Vital signs reviewed and stable  Post vital signs: Reviewed and stable  Last Vitals:  Vitals Value Taken Time  BP 123/68 09/15/23 1133  Temp    Pulse 112 09/15/23 1138  Resp 15 09/15/23 1138  SpO2 97 % 09/15/23 1138  Vitals shown include unfiled device data.  Last Pain:  Vitals:   09/15/23 1133  TempSrc:   PainSc: 0-No pain         Complications: No notable events documented.

## 2023-09-15 NOTE — Anesthesia Procedure Notes (Signed)
Procedure Name: General with mask airway Date/Time: 09/15/2023 11:06 AM  Performed by: Mohammed Kindle, CRNAPre-anesthesia Checklist: Patient identified, Emergency Drugs available, Suction available and Patient being monitored Patient Re-evaluated:Patient Re-evaluated prior to induction Oxygen Delivery Method: Circle system utilized Preoxygenation: Pre-oxygenation with 100% oxygen Induction Type: IV induction Ventilation: Mask ventilation without difficulty Laryngoscope Size: McGraph and 3 Grade View: Grade I Tube type: Oral Tube size: 7.0 mm Number of attempts: 1 Airway Equipment and Method: Stylet and Oral airway Placement Confirmation: ETT inserted through vocal cords under direct vision, positive ETCO2, breath sounds checked- equal and bilateral and CO2 detector Tube secured with: Tape Dental Injury: Teeth and Oropharynx as per pre-operative assessment

## 2023-09-15 NOTE — H&P (Signed)
[name], MD [address]  [address]  [place], [place] [address]  Main: [phone]  Fax: [phone] Pager: [phone]  Primary Care Physician:  Glori Luis, MD Primary Gastroenterologist:  Dr. [name]  Pre-Procedure History & Physical: HPI:  Shawn English is a 47 y.o. male is here for an colonoscopy.   Past Medical History:  Diagnosis Date   Chicken pox    Hypertension     Past Surgical History:  Procedure Laterality Date   CYST REMOVAL HAND  11/10/2007   Benign cyst removed from right arm   Left shoulder surgery Left 02/21/2022    Prior to Admission medications   Medication Sig Start Date End Date Taking? Authorizing Provider  EPINEPHrine 0.3 mg/0.3 mL IJ SOAJ injection Inject 0.3 mLs (0.3 mg total) into the muscle as needed. 01/02/20  Yes Glori Luis, MD  hydrochlorothiazide (HYDRODIURIL) 25 MG tablet TAKE 1 TABLET(25 MG) BY MOUTH DAILY 07/14/23  Yes Glori Luis, MD  losartan (COZAAR) 100 MG tablet TAKE 1 TABLET(100 MG) BY MOUTH DAILY 04/16/23  Yes Glori Luis, MD    Allergies as of 08/30/2023 - Review Complete 08/30/2023  Allergen Reaction Noted   Bee venom Swelling 03/21/2015   Fish-derived products Anaphylaxis 03/21/2015   Penicillins Anaphylaxis 03/21/2015   Sulfa antibiotics Nausea And Vomiting 03/21/2015    Family History  Problem Relation Age of Onset   Hypertension Father    Cancer Maternal Grandmother        breast    Social History   Socioeconomic History   Marital status: Married    Spouse name: Not on file   Number of children: Not on file   Years of education: Not on file   Highest education level: Not on file  Occupational History   Not on file  Tobacco Use   Smoking status: Former    Current packs/day: 0.00    Types: Cigarettes    Quit date: 09/09/2014    Years since quitting: 9.0   Smokeless tobacco: Never  Vaping Use   Vaping status: Never Used  Substance and Sexual Activity   Alcohol  use: Yes    Alcohol/week: 1.0 standard drink of alcohol    Types: 1 Standard drinks or equivalent per week    Comment: Occasional use    Drug use: No   Sexual activity: Yes    Partners: Female    Comment: Fiance  Other Topics Concern   Not on file  Social History Narrative   Lives with fiance    Child on the way, girl, due in July 2016    No Pets    Work at [employer]    Left handed   Caffeine- 2 cups of coffee, occasional soda    Enjoys playing guitar and traveling    Social Determinants of Corporate investment banker Strain: Not on file  Food Insecurity: Not on file  Transportation Needs: Not on file  Physical Activity: Not on file  Stress: Not on file  Social Connections: Not on file  Intimate Partner Violence: Not on file    Review of Systems: See HPI, otherwise negative ROS  Physical Exam: BP 116/86   Pulse 100   Temp (!) 96 F (35.6 C) (Temporal)   Resp 20   Ht 5' 6.5" (1.689 m)   Wt 97.3 kg   SpO2 99%   BMI 34.09 kg/m  General:   Alert,  pleasant and cooperative  in NAD Head:  Normocephalic and atraumatic. Neck:  Supple; no masses or thyromegaly. Lungs:  Clear throughout to auscultation.    Heart:  Regular rate and rhythm. Abdomen:  Soft, nontender and nondistended. Normal bowel sounds, without guarding, and without rebound.   Neurologic:  Alert and  oriented x4;  grossly normal neurologically.  Impression/Plan: [name] is here for an colonoscopy to be performed for colon cancer screening  Risks, benefits, limitations, and alternatives regarding  colonoscopy have been reviewed with the patient.  Questions have been answered.  All parties agreeable.   Lannette Donath, MD  09/15/2023, 10:47 AM

## 2023-09-15 NOTE — Anesthesia Preprocedure Evaluation (Addendum)
Anesthesia Evaluation  Patient identified by MRN, date of birth, ID band Patient awake    Reviewed: Allergy & Precautions, NPO status , Patient's Chart, lab work & pertinent test results  History of Anesthesia Complications Negative for: history of anesthetic complications  Airway Mallampati: IV   Neck ROM: Full    Dental no notable dental hx.    Pulmonary former smoker (quit 2015)   Pulmonary exam normal breath sounds clear to auscultation       Cardiovascular hypertension, Normal cardiovascular exam Rhythm:Regular Rate:Normal     Neuro/Psych negative neurological ROS     GI/Hepatic negative GI ROS,,,  Endo/Other  Obesity   Renal/GU negative Renal ROS     Musculoskeletal   Abdominal   Peds  Hematology negative hematology ROS (+)   Anesthesia Other Findings   Reproductive/Obstetrics                             Anesthesia Physical Anesthesia Plan  ASA: 2  Anesthesia Plan: General   Post-op Pain Management:    Induction: Intravenous  PONV Risk Score and Plan: 2 and Propofol infusion, TIVA and Treatment may vary due to age or medical condition  Airway Management Planned: Natural Airway  Additional Equipment:   Intra-op Plan:   Post-operative Plan:   Informed Consent: I have reviewed the patients History and Physical, chart, labs and discussed the procedure including the risks, benefits and alternatives for the proposed anesthesia with the patient or authorized representative who has indicated his/her understanding and acceptance.       Plan Discussed with: CRNA  Anesthesia Plan Comments: (LMA/GETA backup discussed.  Patient consented for risks of anesthesia including but not limited to:  - adverse reactions to medications - damage to eyes, teeth, lips or other oral mucosa - nerve damage due to positioning  - sore throat or hoarseness - damage to heart, brain, nerves,  lungs, other parts of body or loss of life  Informed patient about role of CRNA in peri- and intra-operative care.  Patient voiced understanding.)       Anesthesia Quick Evaluation

## 2023-09-16 ENCOUNTER — Encounter: Payer: Self-pay | Admitting: Gastroenterology

## 2023-09-16 LAB — SURGICAL PATHOLOGY

## 2023-09-17 ENCOUNTER — Encounter: Payer: Self-pay | Admitting: Gastroenterology

## 2023-10-11 ENCOUNTER — Other Ambulatory Visit: Payer: Self-pay | Admitting: Family Medicine

## 2023-10-11 DIAGNOSIS — I10 Essential (primary) hypertension: Secondary | ICD-10-CM

## 2024-01-12 ENCOUNTER — Other Ambulatory Visit: Payer: Self-pay

## 2024-01-12 DIAGNOSIS — I10 Essential (primary) hypertension: Secondary | ICD-10-CM

## 2024-01-12 MED ORDER — HYDROCHLOROTHIAZIDE 25 MG PO TABS: 25.0000 mg | ORAL_TABLET | Freq: Every day | ORAL | 1 refills | Status: DC

## 2024-02-24 ENCOUNTER — Encounter: Payer: Self-pay | Admitting: Nurse Practitioner

## 2024-02-24 ENCOUNTER — Ambulatory Visit: Payer: BC Managed Care – PPO | Admitting: Nurse Practitioner

## 2024-02-24 VITALS — BP 116/82 | HR 96 | Temp 98.7°F | Ht 66.5 in | Wt 212.2 lb

## 2024-02-24 DIAGNOSIS — K760 Fatty (change of) liver, not elsewhere classified: Secondary | ICD-10-CM

## 2024-02-24 DIAGNOSIS — I10 Essential (primary) hypertension: Secondary | ICD-10-CM

## 2024-02-24 DIAGNOSIS — E7800 Pure hypercholesterolemia, unspecified: Secondary | ICD-10-CM

## 2024-02-24 MED ORDER — HYDROCHLOROTHIAZIDE 25 MG PO TABS: 25.0000 mg | ORAL_TABLET | Freq: Every day | ORAL | 3 refills | Status: DC

## 2024-02-24 MED ORDER — LOSARTAN POTASSIUM 100 MG PO TABS: ORAL_TABLET | ORAL | 3 refills | Status: DC

## 2024-02-24 NOTE — Progress Notes (Signed)
 Shawn Dicker, NP-C Phone: [phone]  Shawn English is a 48 y.o. male who presents today for transfer of care.   Discussed the use of AI scribe software for clinical note transcription with the patient, who gave verbal consent to proceed.  History of Present Illness   Anikin Prosser "Shawn English" is a 48 year old male with hypertension and hyperlipidemia who presents for a transfer of care.  He has a history of hypertension and is currently taking losartan and hydrochlorothiazide every morning. He monitors his blood pressure at home, with readings around 128/86 mmHg, and 116/82 mmHg at the clinic, generally maintaining levels in the 120s/80s range. No chest pain, shortness of breath, or leg swelling. He experiences dizziness when getting up too quickly at night.  He has elevated LDL cholesterol levels, not requiring medication. His liver enzymes were previously elevated due to fatty liver but have decreased significantly since 2020-2021. He has gained about 20 pounds due to reduced exercise following a rotator cuff injury and a torn distal biceps tendon. He is not meal prepping as before, with a demanding job contributing to his lack of exercise.  He has several allergies, including to all seafood and bee stings, and carries an EpiPen. He has reduced alcohol consumption, drinking mostly water and having a drink twice a week.      Social History   Tobacco Use  Smoking Status Former   Current packs/day: 0.00   Types: Cigarettes   Quit date: 09/09/2014   Years since quitting: 9.4  Smokeless Tobacco Never    Current Outpatient Medications on File Prior to Visit  Medication Sig Dispense Refill   EPINEPHrine 0.3 mg/0.3 mL IJ SOAJ injection Inject 0.3 mLs (0.3 mg total) into the muscle as needed. 2 each 0   No current facility-administered medications on file prior to visit.    ROS see history of present illness  Objective  Physical Exam Vitals:   02/24/24 0914  BP: 116/82  Pulse: 96   Temp: 98.7 F (37.1 C)  SpO2: 95%    BP Readings from Last 3 Encounters:  02/24/24 116/82  09/15/23 (!) 136/90  08/24/23 128/80   Wt Readings from Last 3 Encounters:  02/24/24 212 lb 3.2 oz (96.3 kg)  09/15/23 214 lb 6.4 oz (97.3 kg)  08/24/23 218 lb 6.4 oz (99.1 kg)    Physical Exam Constitutional:      General: He is not in acute distress.    Appearance: Normal appearance.  HENT:     Head: Normocephalic.  Cardiovascular:     Rate and Rhythm: Normal rate and regular rhythm.     Heart sounds: Normal heart sounds.  Pulmonary:     Effort: Pulmonary effort is normal.     Breath sounds: Normal breath sounds.  Skin:    General: Skin is warm and dry.  Neurological:     General: No focal deficit present.     Mental Status: He is alert.  Psychiatric:        Mood and Affect: Mood normal.        Behavior: Behavior normal.     Assessment/Plan: Please see individual problem list.  Essential hypertension Assessment & Plan: His hypertension is well-controlled with losartan and hydrochlorothiazide, maintaining blood pressure consistently in the 120s/80s. Continue losartan 100 mg daily and hydrochlorothiazide 25 mg daily. Monitor blood pressure periodically.  Orders: -     hydroCHLOROthiazide; Take 1 tablet (25 mg total) by mouth daily.  Dispense: 90 tablet; Refill: 3 -  Losartan Potassium; TAKE 1 TABLET(100 MG) BY MOUTH DAILY  Dispense: 90 tablet; Refill: 3  Elevated LDL cholesterol level Assessment & Plan: LDL levels are elevated but do not require medication. Low risk ASCVD score. The 10-year ASCVD risk score (Arnett DK, et al., 2019) is: 2.6%. Emphasize lifestyle modifications, including a healthy diet and regular exercise.   Fatty liver disease, nonalcoholic Assessment & Plan: Mildly elevated liver enzymes have decreased significantly, with no significant concern. Reduced alcohol intake may contribute to this improvement. Continue monitoring liver enzymes and  maintain reduced alcohol consumption. Exercise is limited due to injuries and work schedule, but he plans to set up a home gym to improve his routine. Weight gain has been noted. Encourage a healthy diet and regular exercise.      Return in about 6 months (around 08/23/2024) for Annual Exam, sooner as needed.   Bluford Burkitt, NP-C [facility]

## 2024-02-24 NOTE — Assessment & Plan Note (Signed)
 LDL levels are elevated but do not require medication. Low risk ASCVD score. The 10-year ASCVD risk score (Arnett DK, et al., 2019) is: 2.6%. Emphasize lifestyle modifications, including a healthy diet and regular exercise.

## 2024-02-24 NOTE — Assessment & Plan Note (Signed)
 Mildly elevated liver enzymes have decreased significantly, with no significant concern. Reduced alcohol intake may contribute to this improvement. Continue monitoring liver enzymes and maintain reduced alcohol consumption. Exercise is limited due to injuries and work schedule, but he plans to set up a home gym to improve his routine. Weight gain has been noted. Encourage a healthy diet and regular exercise.

## 2024-02-24 NOTE — Assessment & Plan Note (Signed)
 His hypertension is well-controlled with losartan and hydrochlorothiazide, maintaining blood pressure consistently in the 120s/80s. Continue losartan 100 mg daily and hydrochlorothiazide 25 mg daily. Monitor blood pressure periodically.

## 2024-06-27 ENCOUNTER — Telehealth: Payer: Self-pay | Admitting: Nurse Practitioner

## 2024-06-27 NOTE — Telephone Encounter (Signed)
 Left message for patient to call and reschedule 08/29/2024 appointment.

## 2024-08-29 ENCOUNTER — Encounter: Admitting: Nurse Practitioner

## 2024-11-03 ENCOUNTER — Encounter: Payer: Self-pay | Admitting: Nurse Practitioner

## 2024-11-03 ENCOUNTER — Ambulatory Visit: Admitting: Nurse Practitioner

## 2024-11-03 VITALS — BP 142/90 | HR 76 | Temp 98.5°F | Ht 66.5 in | Wt 200.4 lb

## 2024-11-03 DIAGNOSIS — Z0000 Encounter for general adult medical examination without abnormal findings: Secondary | ICD-10-CM

## 2024-11-03 DIAGNOSIS — E7800 Pure hypercholesterolemia, unspecified: Secondary | ICD-10-CM | POA: Diagnosis not present

## 2024-11-03 DIAGNOSIS — Z1329 Encounter for screening for other suspected endocrine disorder: Secondary | ICD-10-CM

## 2024-11-03 DIAGNOSIS — K760 Fatty (change of) liver, not elsewhere classified: Secondary | ICD-10-CM

## 2024-11-03 DIAGNOSIS — E66811 Obesity, class 1: Secondary | ICD-10-CM

## 2024-11-03 DIAGNOSIS — K219 Gastro-esophageal reflux disease without esophagitis: Secondary | ICD-10-CM | POA: Diagnosis not present

## 2024-11-03 DIAGNOSIS — E6609 Other obesity due to excess calories: Secondary | ICD-10-CM

## 2024-11-03 DIAGNOSIS — I10 Essential (primary) hypertension: Secondary | ICD-10-CM | POA: Diagnosis not present

## 2024-11-03 DIAGNOSIS — Z6831 Body mass index (BMI) 31.0-31.9, adult: Secondary | ICD-10-CM | POA: Diagnosis not present

## 2024-11-03 DIAGNOSIS — Z3009 Encounter for other general counseling and advice on contraception: Secondary | ICD-10-CM

## 2024-11-03 MED ORDER — HYDROCHLOROTHIAZIDE 25 MG PO TABS: 25.0000 mg | ORAL_TABLET | Freq: Every day | ORAL | 3 refills | Status: AC

## 2024-11-03 MED ORDER — LOSARTAN POTASSIUM 100 MG PO TABS: ORAL_TABLET | ORAL | 3 refills | Status: AC

## 2024-11-03 NOTE — Assessment & Plan Note (Addendum)
 Blood pressure was elevated today due to stress and pain, previously well controlled. Readings typically 120-121/74-81 mmHg at home with losartan  and hydrochlorothiazide . He reports no chest pain, shortness of breath, or dizziness. Blood pressure was rechecked before leaving the clinic. Continue losartan  100 mg daily and hydrochlorothiazide  25 mg daily. Monitor blood pressure periodically.

## 2024-11-03 NOTE — Assessment & Plan Note (Signed)
 Cholesterol levels are elevated but managed with diet and exercise. No medication is currently prescribed. Continue healthy diet and regular exercise. Check lipid panel.

## 2024-11-03 NOTE — Assessment & Plan Note (Signed)
 He experiences occasional heartburn due to irregular eating patterns and an empty stomach. Symptoms are not severe enough for daily medication. He should avoid trigger foods such as spicy, acidic, and greasy foods, avoid eating late at night and lying down after meals, and monitor symptoms. Consider daily medication if symptoms worsen.

## 2024-11-03 NOTE — Progress Notes (Signed)
 " Shawn Glance, NP-C Phone: [phone]  Shawn English is a 48 y.o. male who presents today for annual exam.   Discussed the use of AI scribe software for clinical note transcription with the patient, who gave verbal consent to proceed.  History of Present Illness   Shawn English is a 48 year old male who presents for an annual physical exam.  He is experiencing back pain due to a slipped disc and is currently under the care of a chiropractor. He describes his pain as traumatic and reports home blood pressure readings typically around 120-121/74-81 mmHg.  No chest pain, shortness of breath, dizziness, abdominal pain, or trouble using the bathroom. No hematuria, headaches except for stress-related ones, dysphagia, skin changes, rashes, peripheral edema, or joint pains aside from the back issue.  His past medical history includes elevated cholesterol and fatty liver, both managed with diet and exercise. He has reduced his alcohol intake significantly. He has lost 12 pounds since his last visit.  He works in [employer] and describes a hectic work schedule, which impacts his diet and exercise routine. He tries to maintain a protein-heavy diet and avoid eating out. He consumes alcohol only on special occasions and denies smoking or drug use. He gets about six hours of sleep per night, though it is often interrupted due to work demands.  He experiences occasional heartburn, which he attributes to inconsistent eating patterns. He manages this with dietary adjustments and reports it is not severe enough to require daily medication.  His wife is concerned about potential ADHD, but he attributes his difficulty focusing at home to work-related mental burnout.      Tobacco Use History[1]  Medications Ordered Prior to Encounter[2]   ROS see history of present illness  Objective  Physical Exam Vitals:   11/03/24 0920 11/03/24 0938  BP: (!) 144/100 (!) 142/90  Pulse: 76   Temp: 98.5 F (36.9 C)    SpO2: 98%     BP Readings from Last 3 Encounters:  11/03/24 (!) 142/90  02/24/24 116/82  09/15/23 (!) 136/90   Wt Readings from Last 3 Encounters:  11/03/24 200 lb 6.4 oz (90.9 kg)  02/24/24 212 lb 3.2 oz (96.3 kg)  09/15/23 214 lb 6.4 oz (97.3 kg)    Physical Exam Constitutional:      General: He is not in acute distress.    Appearance: Normal appearance.  HENT:     Head: Normocephalic.     Right Ear: Tympanic membrane normal.     Left Ear: Tympanic membrane normal.     Nose: Nose normal.     Mouth/Throat:     Mouth: Mucous membranes are moist.     Pharynx: Oropharynx is clear.  Eyes:     Conjunctiva/sclera: Conjunctivae normal.     Pupils: Pupils are equal, round, and reactive to light.  Neck:     Thyroid : No thyromegaly.  Cardiovascular:     Rate and Rhythm: Normal rate and regular rhythm.     Heart sounds: Normal heart sounds.  Pulmonary:     Effort: Pulmonary effort is normal.     Breath sounds: Normal breath sounds.  Abdominal:     General: Abdomen is flat. Bowel sounds are normal.     Palpations: Abdomen is soft. There is no mass.     Tenderness: There is no abdominal tenderness.  Musculoskeletal:        General: Normal range of motion.  Lymphadenopathy:     Cervical: No cervical adenopathy.  Skin:    General: Skin is warm and dry.     Findings: No rash.  Neurological:     General: No focal deficit present.     Mental Status: He is alert.  Psychiatric:        Mood and Affect: Mood normal.        Behavior: Behavior normal.      Assessment/Plan: Please see individual problem list.  Routine general medical examination at a health care facility Assessment & Plan: Physical exam complete. We will check lab work as outlined. Colonoscopy is up to date, next due in 2027. Prostate screening not indicated. Declines flu and additional COVID vaccines. Tetanus vaccine is up to date. He does not smoke, rarely consumes alcohol, and does not use drugs. Regular  eye exams are maintained, but he has no current dental care. He should continue regular eye exams and consider establishing care with a dentist. Encourage healthy diet and regular exercise. Return to care in one year, sooner as needed.    Essential hypertension Assessment & Plan: Blood pressure was elevated today due to stress and pain, previously well controlled. Readings typically 120-121/74-81 mmHg at home with losartan  and hydrochlorothiazide . He reports no chest pain, shortness of breath, or dizziness. Blood pressure was rechecked before leaving the clinic. Continue losartan  100 mg daily and hydrochlorothiazide  25 mg daily. Monitor blood pressure periodically.  Orders: -     Losartan  Potassium; TAKE 1 TABLET(100 MG) BY MOUTH DAILY  Dispense: 90 tablet; Refill: 3 -     hydroCHLOROthiazide ; Take 1 tablet (25 mg total) by mouth daily.  Dispense: 90 tablet; Refill: 3 -     CBC with Differential/Platelet -     Comprehensive metabolic panel with GFR  Fatty liver disease, nonalcoholic Assessment & Plan: Reduced alcohol intake. Continue monitoring liver enzymes and maintain reduced alcohol consumption.  Encourage a healthy diet and regular exercise. Check CMP.   Orders: -     Comprehensive metabolic panel with GFR  Elevated LDL cholesterol level Assessment & Plan: Cholesterol levels are elevated but managed with diet and exercise. No medication is currently prescribed. Continue healthy diet and regular exercise. Check lipid panel.   Orders: -     Lipid panel  Gastroesophageal reflux disease, unspecified whether esophagitis present Assessment & Plan: He experiences occasional heartburn due to irregular eating patterns and an empty stomach. Symptoms are not severe enough for daily medication. He should avoid trigger foods such as spicy, acidic, and greasy foods, avoid eating late at night and lying down after meals, and monitor symptoms. Consider daily medication if symptoms  worsen.   Class 1 obesity due to excess calories without serious comorbidity with body mass index (BMI) of 31.0 to 31.9 in adult Assessment & Plan: He has lost 12 pounds since the last visit. Weight management will continue with dietary adjustments and exercise. Check A1c today.   Orders: -     Hemoglobin A1c  Encounter for vasectomy counseling -     Ambulatory referral to Urology  Thyroid  disorder screen -     TSH     Return in about 1 year (around 11/03/2025) for Annual Exam, sooner as needed.   Shawn Glance, NP-C [facility] [place]      [1]  Social History Tobacco Use  Smoking Status Former   Current packs/day: 0.00   Types: Cigarettes   Quit date: 09/09/2014   Years since quitting: 10.1  Smokeless Tobacco Never  [2]  Current Outpatient Medications  on File Prior to Visit  Medication Sig Dispense Refill   EPINEPHrine  0.3 mg/0.3 mL IJ SOAJ injection Inject 0.3 mLs (0.3 mg total) into the muscle as needed. 2 each 0   No current facility-administered medications on file prior to visit.   "

## 2024-11-03 NOTE — Assessment & Plan Note (Signed)
 Physical exam complete. We will check lab work as outlined. Colonoscopy is up to date, next due in 2027. Prostate screening not indicated. Declines flu and additional COVID vaccines. Tetanus vaccine is up to date. He does not smoke, rarely consumes alcohol, and does not use drugs. Regular eye exams are maintained, but he has no current dental care. He should continue regular eye exams and consider establishing care with a dentist. Encourage healthy diet and regular exercise. Return to care in one year, sooner as needed.

## 2024-11-03 NOTE — Assessment & Plan Note (Signed)
 He has lost 12 pounds since the last visit. Weight management will continue with dietary adjustments and exercise. Check A1c today.

## 2024-11-03 NOTE — Assessment & Plan Note (Signed)
 Reduced alcohol intake. Continue monitoring liver enzymes and maintain reduced alcohol consumption.  Encourage a healthy diet and regular exercise. Check CMP.

## 2024-11-04 LAB — CBC WITH DIFFERENTIAL/PLATELET
Basophils Absolute: 0 x10E3/uL (ref 0.0–0.2)
Basos: 1 %
EOS (ABSOLUTE): 0.1 x10E3/uL (ref 0.0–0.4)
Eos: 1 %
Hematocrit: 42.1 % (ref 37.5–51.0)
Hemoglobin: 14 g/dL (ref 13.0–17.7)
Immature Grans (Abs): 0.1 x10E3/uL (ref 0.0–0.1)
Immature Granulocytes: 1 %
Lymphocytes Absolute: 0.5 x10E3/uL — ABNORMAL LOW (ref 0.7–3.1)
Lymphs: 9 %
MCH: 29.1 pg (ref 26.6–33.0)
MCHC: 33.3 g/dL (ref 31.5–35.7)
MCV: 88 fL (ref 79–97)
Monocytes Absolute: 0.6 x10E3/uL (ref 0.1–0.9)
Monocytes: 12 %
Neutrophils Absolute: 3.7 x10E3/uL (ref 1.4–7.0)
Neutrophils: 76 %
Platelets: 224 x10E3/uL (ref 150–450)
RBC: 4.81 x10E6/uL (ref 4.14–5.80)
RDW: 13.8 % (ref 11.6–15.4)
WBC: 4.8 x10E3/uL (ref 3.4–10.8)

## 2024-11-04 LAB — COMPREHENSIVE METABOLIC PANEL WITH GFR
ALT: 42 IU/L (ref 0–44)
AST: 24 IU/L (ref 0–40)
Albumin: 4.4 g/dL (ref 4.1–5.1)
Alkaline Phosphatase: 73 IU/L (ref 47–123)
BUN/Creatinine Ratio: 13 (ref 9–20)
BUN: 12 mg/dL (ref 6–24)
Bilirubin Total: 0.6 mg/dL (ref 0.0–1.2)
CO2: 24 mmol/L (ref 20–29)
Calcium: 9.5 mg/dL (ref 8.7–10.2)
Chloride: 104 mmol/L (ref 96–106)
Creatinine, Ser: 0.93 mg/dL (ref 0.76–1.27)
Globulin, Total: 2.1 g/dL (ref 1.5–4.5)
Glucose: 95 mg/dL (ref 70–99)
Potassium: 3.9 mmol/L (ref 3.5–5.2)
Sodium: 142 mmol/L (ref 134–144)
Total Protein: 6.5 g/dL (ref 6.0–8.5)
eGFR: 103 mL/min/1.73

## 2024-11-04 LAB — HEMOGLOBIN A1C
Est. average glucose Bld gHb Est-mCnc: 88 mg/dL
Hgb A1c MFr Bld: 4.7 % — ABNORMAL LOW (ref 4.8–5.6)

## 2024-11-04 LAB — LIPID PANEL
Chol/HDL Ratio: 4 ratio (ref 0.0–5.0)
Cholesterol, Total: 167 mg/dL (ref 100–199)
HDL: 42 mg/dL
LDL Chol Calc (NIH): 111 mg/dL — ABNORMAL HIGH (ref 0–99)
Triglycerides: 72 mg/dL (ref 0–149)
VLDL Cholesterol Cal: 14 mg/dL (ref 5–40)

## 2024-11-04 LAB — TSH: TSH: 1.35 u[IU]/mL (ref 0.450–4.500)

## 2024-11-06 ENCOUNTER — Ambulatory Visit: Payer: Self-pay | Admitting: Nurse Practitioner

## 2025-11-07 ENCOUNTER — Encounter: Admitting: Nurse Practitioner
